# Patient Record
Sex: Male | Born: 1965 | Race: White | Hispanic: No | Smoking: Former smoker
Health system: Southern US, Community
[De-identification: ages and names within clinical notes are randomized; demographics above are authoritative.]

## PROBLEM LIST (undated history)

## (undated) DIAGNOSIS — F32A Depression, unspecified: Secondary | ICD-10-CM

## (undated) DIAGNOSIS — B029 Zoster without complications: Secondary | ICD-10-CM

## (undated) HISTORY — DX: Depression, unspecified: F32.A

---

## 2007-11-13 ENCOUNTER — Ambulatory Visit: Payer: Self-pay | Admitting: Physical Medicine & Rehabilitation

## 2007-11-13 ENCOUNTER — Encounter
Admission: RE | Admit: 2007-11-13 | Discharge: 2008-01-29 | Payer: Self-pay | Admitting: Physical Medicine & Rehabilitation

## 2007-12-14 ENCOUNTER — Ambulatory Visit: Payer: Self-pay | Admitting: Physical Medicine & Rehabilitation

## 2008-01-29 ENCOUNTER — Ambulatory Visit: Payer: Self-pay | Admitting: Physical Medicine & Rehabilitation

## 2008-04-23 ENCOUNTER — Ambulatory Visit (HOSPITAL_COMMUNITY): Admission: RE | Admit: 2008-04-23 | Discharge: 2008-04-23 | Payer: Self-pay | Admitting: Orthopaedic Surgery

## 2008-07-31 ENCOUNTER — Emergency Department (HOSPITAL_BASED_OUTPATIENT_CLINIC_OR_DEPARTMENT_OTHER): Admission: EM | Admit: 2008-07-31 | Discharge: 2008-07-31 | Payer: Self-pay | Admitting: Emergency Medicine

## 2009-11-23 ENCOUNTER — Emergency Department (HOSPITAL_BASED_OUTPATIENT_CLINIC_OR_DEPARTMENT_OTHER): Admission: EM | Admit: 2009-11-23 | Discharge: 2009-11-23 | Payer: Self-pay | Admitting: Emergency Medicine

## 2010-06-22 ENCOUNTER — Encounter: Admission: RE | Admit: 2010-06-22 | Discharge: 2010-06-22 | Payer: Self-pay | Admitting: Geriatric Medicine

## 2010-07-09 ENCOUNTER — Encounter: Admission: RE | Admit: 2010-07-09 | Discharge: 2010-07-09 | Payer: Self-pay | Admitting: Geriatric Medicine

## 2010-09-19 ENCOUNTER — Encounter: Payer: Self-pay | Admitting: Geriatric Medicine

## 2011-01-12 NOTE — Assessment & Plan Note (Signed)
A 45 year old male with history of back pain since age 77.  He has had  some exacerbation last fall associated with seizures that were  result  of hyponatremia.  He was hospitalized at [facility].  He has  had MRI, showing no significant stenosis.  He did receive facet  injections on December 14, 2007, and his back pain has really been cut in  half.  We have been able to reduce his hydrocodone from 10 mg to 7.5 mg.  His main complaint is his neck at this point.  He was originally  scheduled for repeat facet injection today, but he states that this is  feeling pretty good.  He does have some left buttock and left posterior  thigh pain.   He has some numbness and tingling in the posterior thigh as well.   EXAMINATION:  He has tenderness in the bilateral upper trapezius.  His  neck range of motion is about 75%.  His upper extremity strength is  normal.   IMPRESSION:  Cervical pain, question if this is a myofascial versus  cervical facet.  He does have some limitation in range of motion.  He  does have a head forward posture.  I will send him to physical therapy,  after I do trigger point injections, to see if they can work on some  postural training, some upper back strengthening.  I will see him back  in about a month for possible repeat trigger points.      Erick Colace, M.D.  Electronically Signed     AEK/MedQ  D:  01/29/2008 10:32:44  T:  01/30/2008 03:32:57  Job #:  [id number]   cc:   Casimiro Needle, Physical Therapy  [facility]

## 2011-01-12 NOTE — Op Note (Signed)
NAMENICHAEL, EHLY               ACCOUNT NO.:  [id number]   MEDICAL RECORD NO.:  [id number]          PATIENT TYPE:  AMB   LOCATION:  SDS                          FACILITY:  [facility]   PHYSICIAN:  Vanita Panda. Magnus Ivan, M.D.DATE OF BIRTH:  10-Nov-1965   DATE OF PROCEDURE:  04/23/2008  DATE OF DISCHARGE:  04/23/2008                               OPERATIVE REPORT   PREOPERATIVE DIAGNOSIS:  Right wrist laceration with questionable nerve  and tendon injury.   POSTOPERATIVE DIAGNOSIS:  Right wrist laceration with questionable nerve  and tendon injury.   FINDINGS:  Superficial laceration to right wrist median nerve with a  small rent in outer nerve fibers, no nerve repair needed, NeuroGen  conduit placed around nerve.   SURGEON:  Vanita Panda. Magnus Ivan, MD   ANESTHESIA:  General.   ANTIBIOTICS:  Ancef 1 g IV.   COMPLICATIONS:  None.   ESTIMATED BLOOD LOSS:  Minimal.   TOURNIQUET TIME:  37 minutes.   INDICATIONS:  Briefly, Mr. Fridman is 45 year old who 9 days ago cut his  right wrist with some glass.  Apparently, he was seen in the [facility] emergency room and according to his wife an orthopedic  surgeon did see him.  The laceration was repaired in the emergency room,  and he was given a followup to call up the [employer]  the next day.  Apparently, he could not be worked in for 2 more weeks.  This seemed to be unsatisfactory to the patient, and he sought treatment  in [place].  He then went to the Urgent Care Clinic at [facility] on this  past Friday with exquisite pain in his wrist.  The physician on-call  there felt that it is potential for a tendon nerve injury and gave him  followup in my office on Monday for further evaluation and treatment  since I do a hand surgery.  He was given 40 Percocet on Friday but had  gone through all of those through the weekend by his appointment to see  me.  In my office, he reported exquisite pain and  throbbing and global  numbness and tingling in his hand.  On examination, he had subjective  decreased sensation in the median nerve distribution and abundant pain  but did have intact pulses on exam and intact tendon function.  He is  recommended he undergo exploration of the laceration and potential  repair of the nerve.  He understood risks and benefits of this and  agreed to proceed with surgery.   PROCEDURE IN DETAIL:  After informed consent was obtained, appropriate  right leg was marked.  He was brought to the operating room and placed  supine on the operating table.  General anesthesia was then obtained.  A  nonsterile tourniquet was placed around his upper arm and his wrist was  prepped and draped with Betadine scrub and paint.  A time-out was called  to identify the correct patient and correct arm.  I then used Esmarch to  wrap up the arm.  A tourniquet was inflated 250 mmHg pressure.  His  laceration was a longitudinal laceration across the wrist 2 cm proximal  to the wrist crease.  I then extended this in a Z-type fashion distally  and proximally to expose the laceration.  I explored the wrist and you  could see the radial and ulnar arteries were intact and all the tendons  were intact.  The palmaris longus tendon was cut because this was a  nonessential tendon.  I did completely released the median nerve around  the wrist with neurolysis and found hematoma around the nerve.  I then  examined the nerve closely and you could see that there was a laceration  in the superficial nerve fibers on the radial aspect but the nerve  itself was intact.  This was more of the epineurium.  I elected not to  put any sutures through this as a repair since it was just a few  millimeters in depth.  I then chose to place a NeuroGen conduit tube  around the nerve for protection and the nerve lay back down into the  deep fibers.  I then copiously irrigated the wound again and closed the  skin and  its entirety with interrupted 3-0 nylon suture.  Xeroform  followed by well-padded sterile dressing was applied.  The tourniquet  was let down.  The fingers did pink nicely.  The patient was awakened,  extubated, and taken to recovery room in stable condition.      Vanita Panda. Magnus Ivan, M.D.  Electronically Signed     CYB/MEDQ  D:  04/23/2008  T:  04/24/2008  Job:  [id number]

## 2011-01-12 NOTE — Procedures (Signed)
NAMEGEVIN, PEREA               ACCOUNT NO.:  [id number]   MEDICAL RECORD NO.:  [id number]           PATIENT TYPE:   LOCATION:                                 FACILITY:   PHYSICIAN:  Erick Colace, M.D.DATE OF BIRTH:  07-18-1966   DATE OF PROCEDURE:  12/14/2007  DATE OF DISCHARGE:                               OPERATIVE REPORT   PROCEDURE:  Bilateral L5 dorsal ramus injection, bilateral L4 medial  branch block, bilateral L3 medial branch block under fluoroscopic  guidance.   INDICATION:  Lumbar spondylosis with facet syndrome.  The pain is only  partially responsive to medication management including narcotic  analgesic medications.  The pain interferes with activities of daily  living such as walking, limits walking time to 5 minutes.  He has  difficulty with certain household duties.   Informed consent was obtained after describing risks and benefits of the  procedure with the patient.  These include bleeding, bruising,  infection, temporary or permanent paralysis.  He elects to proceed and  has given written consent.  The patient was placed prone on fluoroscopy  table.  Betadine prep, sterile drape, 25 gauge inch and a half needle  was used to anesthetize skin and subcu tissue, 1% lidocaine x2 mL and a  22 gauge 3-1/2 inch spinal needle was inserted first charting left S1  SAP sacral ala junction, bone contact made and confirmed with lateral  imaging.  Omnipaque 180 x 0.5 mL demonstrated no intravascular uptake  then 0.5 mL of dexamethasone lidocaine solution was injected, the  solution containing 1 mL of 40 mg per mL of dexamethasone and 2 mL of 2%  MPF lidocaine.  Then the left L5 SAP transverse process junction  targeted, bone contact made and confirmed with lateral imaging.  Omnipaque 180 with lidocaine solution was injected.  Then the left L4  SAP transverse process junction targeted, bone contact made and  confirmed with lateral imaging.  Omnipaque 180 x 0.5 mL  demonstrated no  intravascular uptake then 0.5 mL of dexamethasone lidocaine solution was  injected.  The same procedure was repeated on corresponding levels on  the right side using the same needle, injectate and technique.  The  patient tolerated the procedure well.  Pre and post injection vitals  stable.  Post injection instructions given to return in 3 weeks for  repeat.  Pre injection pain level 10/10, post injection pain level 5/10.  Will repeat confirmatory.  Consider radiofrequency.      Erick Colace, M.D.  Electronically Signed     AEK/MEDQ  D:  12/14/2007 10:53:49  T:  12/14/2007 11:24:00  Job:  [id number]   cc:   Casimiro Needle L. Thad Ranger, M.D.  Fax: [phone]

## 2011-01-12 NOTE — Procedures (Signed)
NAMEMIRO, BALDERSON               ACCOUNT NO.:  [id number]   MEDICAL RECORD NO.:  [id number]          PATIENT TYPE:  REC   LOCATION:  TPC                          FACILITY:  [facility]   PHYSICIAN:  Erick Colace, M.D.DATE OF BIRTH:  March 26, 1966   DATE OF PROCEDURE:  01/29/2008  DATE OF DISCHARGE:                               OPERATIVE REPORT   This is bilateral upper trapezius and bilateral levator scapula trigger  point injection.   INDICATIONS:  Cervicothoracic myofascial pain syndrome, only partial  response to medication management including narcotic analgesics.   Informed consent was obtained after describing the risks and benefits of  the procedure to the patient.  These include bleeding, bruising, and  infection.  He elected to proceed and has given written consent.   The patient in seated position, area over the upper trap at the  midclavicular line marked, prepped with Betadine as with the levator  scapula, insertion upon the upper medial scapular border marked, prepped  with Betadine, entered with 25-gauge 1.5-inch needle.  Positive twitch  sign obtained in left upper trap, 0.75 mL of 1% lidocaine with trigger  point deactivation at each site was performed.  The patient tolerated  procedure well.  Postprocedure instructions were given.       Erick Colace, M.D.  Electronically Signed     AEK/MEDQ  D:  01/29/2008 10:30:31  T:  01/30/2008 00:01:16  Job:  [id number]   cc:   Casimiro Needle, Physical Therapy  [facility]

## 2011-06-04 LAB — DIFFERENTIAL
Basophils Absolute: 0.1 10*3/uL (ref 0.0–0.1)
Eosinophils Absolute: 0.1 10*3/uL (ref 0.0–0.7)
Eosinophils Relative: 2 % (ref 0–5)
Lymphs Abs: 1.6 10*3/uL (ref 0.7–4.0)

## 2011-06-04 LAB — COMPREHENSIVE METABOLIC PANEL
ALT: 27 U/L (ref 0–53)
AST: 32 U/L (ref 0–37)
Alkaline Phosphatase: 53 U/L (ref 39–117)
CO2: 28 mEq/L (ref 19–32)
Chloride: 101 mEq/L (ref 96–112)
GFR calc Af Amer: >60 mL/min (ref >60)
GFR calc non Af Amer: >60 mL/min (ref >60)
Potassium: 4.3 mEq/L (ref 3.5–5.1)
Sodium: 138 mEq/L (ref 135–145)
Total Bilirubin: 0.4 mg/dL (ref 0.3–1.2)

## 2011-06-04 LAB — CBC
RBC: 4.71 MIL/uL (ref 4.22–5.81)
WBC: 8.3 10*3/uL (ref 4.0–10.5)

## 2011-06-04 LAB — RPR: RPR Ser Ql: NONREACTIVE

## 2011-07-09 ENCOUNTER — Encounter: Payer: Self-pay | Admitting: *Deleted

## 2011-07-09 ENCOUNTER — Emergency Department (HOSPITAL_COMMUNITY): Payer: Self-pay

## 2011-07-09 ENCOUNTER — Emergency Department (HOSPITAL_COMMUNITY)
Admission: EM | Admit: 2011-07-09 | Discharge: 2011-07-09 | Disposition: A | Discharge status: Home / Self Care | Payer: Self-pay | Attending: Emergency Medicine | Admitting: Emergency Medicine

## 2011-07-09 DIAGNOSIS — Y9289 Other specified places as the place of occurrence of the external cause: Secondary | ICD-10-CM | POA: Insufficient documentation

## 2011-07-09 DIAGNOSIS — S161XXA Strain of muscle, fascia and tendon at neck level, initial encounter: Secondary | ICD-10-CM

## 2011-07-09 DIAGNOSIS — S63501A Unspecified sprain of right wrist, initial encounter: Secondary | ICD-10-CM

## 2011-07-09 DIAGNOSIS — S139XXA Sprain of joints and ligaments of unspecified parts of neck, initial encounter: Secondary | ICD-10-CM | POA: Insufficient documentation

## 2011-07-09 DIAGNOSIS — F17200 Nicotine dependence, unspecified, uncomplicated: Secondary | ICD-10-CM | POA: Insufficient documentation

## 2011-07-09 DIAGNOSIS — S63509A Unspecified sprain of unspecified wrist, initial encounter: Secondary | ICD-10-CM | POA: Insufficient documentation

## 2011-07-09 DIAGNOSIS — W010XXA Fall on same level from slipping, tripping and stumbling without subsequent striking against object, initial encounter: Secondary | ICD-10-CM | POA: Insufficient documentation

## 2011-07-09 MED ORDER — HYDROCODONE-ACETAMINOPHEN 5-325 MG PO TABS
1.0000 | ORAL_TABLET | Freq: Once | ORAL | Status: AC
  Administered 2011-07-09: 1 via ORAL
  Filled 2011-07-09: qty 1

## 2011-07-09 MED ORDER — HYDROCODONE-ACETAMINOPHEN 5-325 MG PO TABS: ORAL_TABLET | ORAL | Status: DC

## 2011-07-09 MED ORDER — IBUPROFEN 800 MG PO TABS
800.0000 mg | ORAL_TABLET | Freq: Once | ORAL | Status: AC
  Administered 2011-07-09: 800 mg via ORAL
  Filled 2011-07-09: qty 1

## 2011-07-09 MED ORDER — TETANUS-DIPHTH-ACELL PERTUSSIS 5-2.5-18.5 LF-MCG/0.5 IM SUSP
0.5000 mL | Freq: Once | INTRAMUSCULAR | Status: AC
  Administered 2011-07-09: 0.5 mL via INTRAMUSCULAR
  Filled 2011-07-09: qty 0.5

## 2011-07-09 NOTE — ED Notes (Signed)
Pt a/ox4. Resp even and unlabored. NAD at this time. D/C instructions and Rx x1 reviewed with pt. Pt verbalized understanding. Pt ambulated to POV with steady gate. Wife with pt to transport home.

## 2011-07-09 NOTE — ED Provider Notes (Signed)
History     CSN: [id number] Arrival date & time: 07/09/2011  2:07 PM   First MD Initiated Contact with Patient 07/09/11 1428      Chief Complaint  Patient presents with  . Fall    (Consider location/radiation/quality/duration/timing/severity/associated sxs/prior treatment) Patient is a 45 y.o. male presenting with fall. The history is provided by the patient. No language interpreter was used.  Fall The accident occurred 6 to 12 hours ago. 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  caught weight on extended R hand .  also twisted neck. Distance fallen: standing height. He landed on concrete. There was no blood loss. The point of impact was the right wrist. The pain is present in the right wrist (cervical spine.). The pain is at a severity of 10/10. He was ambulatory at the scene. There was no entrapment after the fall. There was no drug use involved in the accident. There was no alcohol use involved in the accident. Exacerbated by: movement and palpation. He has tried nothing for the symptoms.    History reviewed. No pertinent past medical history.  History reviewed. No pertinent past surgical history.  No family history on file.  History  Substance Use Topics  . Smoking status: Current Everyday Smoker  . Smokeless tobacco: Not on file  . Alcohol Use: No      Review of Systems  Musculoskeletal:       Trauma  All other systems reviewed and are negative.    Allergies  Review of patient's allergies indicates no known allergies.  Home Medications  No current outpatient prescriptions on file.  BP 109/68  Pulse 87  Temp(Src) 99.8 F (37.7 C) (Oral)  Resp 18  Ht 5\' 9"  (1.753 m)  Wt 165 lb (74.844 kg)  BMI 24.37 kg/m2  SpO2 99%  Physical Exam  Nursing note and vitals reviewed. Constitutional: He is oriented to person, place, and time. He appears well-developed and well-nourished.  HENT:  Head:  Normocephalic and atraumatic.  Eyes: EOM are normal.  Cardiovascular: Normal rate, regular rhythm, normal heart sounds and intact distal pulses.   Pulmonary/Chest: Effort normal and breath sounds normal. No respiratory distress.  Abdominal: Soft. He exhibits no distension. There is no tenderness.  Musculoskeletal: He exhibits tenderness.       Cervical back: He exhibits decreased range of motion, tenderness, bony tenderness and pain. He exhibits no swelling, no edema, no deformity, no laceration, no spasm and normal pulse.       Back:       Right hand: He exhibits decreased range of motion, tenderness and bony tenderness. He exhibits normal capillary refill, no deformity, no laceration and no swelling. normal sensation noted. Normal strength noted.       Hands: Neurological: He is alert and oriented to person, place, and time. He displays normal reflexes.  Skin: Skin is warm and dry.  Psychiatric: He has a normal mood and affect. Judgment normal.    ED Course  Procedures (including critical care time)  Labs Reviewed - No data to display No results found.   No diagnosis found.    MDM          Worthy Rancher, PA 07/09/11 [phone]

## 2011-07-09 NOTE — ED Notes (Signed)
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 Pain to right hand, wrist, and upper back. NAD.

## 2011-07-12 NOTE — ED Provider Notes (Signed)
Medical screening examination/treatment/procedure(s) were performed by non-physician practitioner and as supervising physician I was immediately available for consultation/collaboration.  Nicoletta Dress. Colon Branch, MD 07/12/11 1756

## 2011-12-22 ENCOUNTER — Encounter (HOSPITAL_COMMUNITY): Payer: Self-pay | Admitting: Emergency Medicine

## 2011-12-22 ENCOUNTER — Emergency Department (HOSPITAL_COMMUNITY): Payer: Self-pay

## 2011-12-22 ENCOUNTER — Emergency Department (HOSPITAL_COMMUNITY)
Admission: EM | Admit: 2011-12-22 | Discharge: 2011-12-22 | Disposition: A | Discharge status: Home / Self Care | Payer: Self-pay | Attending: Emergency Medicine | Admitting: Emergency Medicine

## 2011-12-22 DIAGNOSIS — R079 Chest pain, unspecified: Secondary | ICD-10-CM | POA: Insufficient documentation

## 2011-12-22 DIAGNOSIS — S20219A Contusion of unspecified front wall of thorax, initial encounter: Secondary | ICD-10-CM | POA: Insufficient documentation

## 2011-12-22 DIAGNOSIS — M549 Dorsalgia, unspecified: Secondary | ICD-10-CM | POA: Insufficient documentation

## 2011-12-22 DIAGNOSIS — W108XXA Fall (on) (from) other stairs and steps, initial encounter: Secondary | ICD-10-CM | POA: Insufficient documentation

## 2011-12-22 DIAGNOSIS — M542 Cervicalgia: Secondary | ICD-10-CM | POA: Insufficient documentation

## 2011-12-22 DIAGNOSIS — F17200 Nicotine dependence, unspecified, uncomplicated: Secondary | ICD-10-CM | POA: Insufficient documentation

## 2011-12-22 MED ORDER — OXYCODONE-ACETAMINOPHEN 5-325 MG PO TABS: 1.0000 | ORAL_TABLET | ORAL | Status: AC | PRN

## 2011-12-22 MED ORDER — OXYCODONE-ACETAMINOPHEN 5-325 MG PO TABS
1.0000 | ORAL_TABLET | Freq: Once | ORAL | Status: AC
  Administered 2011-12-22: 1 via ORAL
  Filled 2011-12-22: qty 1

## 2011-12-22 MED ORDER — KETOROLAC TROMETHAMINE 60 MG/2ML IM SOLN
60.0000 mg | Freq: Once | INTRAMUSCULAR | Status: AC
  Administered 2011-12-22: 60 mg via INTRAMUSCULAR
  Filled 2011-12-22: qty 2

## 2011-12-22 MED ORDER — IBUPROFEN 800 MG PO TABS: 800.0000 mg | ORAL_TABLET | Freq: Three times a day (TID) | ORAL | Status: AC

## 2011-12-22 MED ORDER — BACITRACIN-NEOMYCIN-POLYMYXIN 400-5-5000 EX OINT
TOPICAL_OINTMENT | Freq: Once | CUTANEOUS | Status: AC
  Administered 2011-12-22: 11:00:00 via TOPICAL
  Filled 2011-12-22: qty 2

## 2011-12-22 NOTE — ED Provider Notes (Signed)
History     CSN: [id number]  Arrival date & time 12/22/11  7829   First MD Initiated Contact with Patient 12/22/11 [phone]      Chief Complaint  Patient presents with  . Fall  . Back Pain    HPI Kristopher Yoder is a 46 y.o. male who presents to the ED for pain in the right ribs. He was going down concrete steps this morning and fell. He hit his right side on the steps. He denies any other injuries. The history was provided by the patient.  History reviewed. No pertinent past medical history.  History reviewed. No pertinent past surgical history.  History reviewed. No pertinent family history.  History  Substance Use Topics  . Smoking status: Current Everyday Smoker    Types: Cigarettes  . Smokeless tobacco: Not on file  . Alcohol Use: No      Review of Systems  Constitutional: Negative for fever and chills.  HENT: Positive for neck pain. Negative for ear pain and nosebleeds.   Eyes: Negative.   Respiratory: Negative for chest tightness and shortness of breath.   Cardiovascular: Negative for palpitations. Chest pain: rib pain.  Gastrointestinal: Negative for nausea, vomiting and abdominal pain.  Musculoskeletal: Positive for back pain. Negative for gait problem.  Skin:       Abrasion right side  Neurological: Negative for dizziness and headaches.  Psychiatric/Behavioral: Negative for agitation. The patient is not nervous/anxious.     Allergies  Review of patient's allergies indicates no known allergies.  Home Medications   Current Outpatient Rx  Name Route Sig Dispense Refill  . HYDROCODONE-ACETAMINOPHEN 5-325 MG PO TABS  One po q 4-6 hrs prn pain 20 tablet 0    BP 118/66  Pulse 90  Temp(Src) 98.4 F (36.9 C) (Oral)  Resp 17  Ht 5\' 9"  (1.753 m)  Wt 180 lb (81.647 kg)  BMI 26.58 kg/m2  SpO2 99%  Physical Exam  Nursing note and vitals reviewed. Constitutional: He is oriented to person, place, and time. He appears well-developed and well-nourished. No  distress.  HENT:  Head: Normocephalic and atraumatic.  Eyes: Conjunctivae and EOM are normal.  Neck: Normal range of motion. Neck supple.       Tenderness on palpation left side  Cardiovascular: Normal rate and regular rhythm.   Pulmonary/Chest: Effort normal and breath sounds normal.   He exhibits tenderness. He exhibits no crepitus and no deformity.         Abrasion and tenderness with palpation right posterior rib area.   Abdominal: There is no tenderness.  Musculoskeletal: Normal range of motion.  Neurological: He is alert and oriented to person, place, and time. No cranial nerve deficit.  Psychiatric: He has a normal mood and affect. His behavior is normal. Judgment and thought content normal.    ED Course  Procedures  Labs Reviewed - No data to display Dg Ribs Unilateral W/chest Right  12/22/2011  *RADIOLOGY REPORT*  Clinical Data: Larey Seat.  Right-sided chest and rib pain.  RIGHT RIBS AND CHEST - 3+ VIEW  Comparison: 12/18/2011  Findings: Pain heart size is normal.  Mediastinal shadows are normal.  Lungs are clear.  No pneumothorax pneumothorax.  Right rib details do not show a fracture.  IMPRESSION: No visible rib fracture.  No active cardiopulmonary disease.  Original Report Authenticated By: Thomasenia Sales, M.D.   Assessment: Contusion right ribs   Abrasion   Plan:  Ibuprofen 800 mg Rx   Percocet Rx   Follow  up with PCP, return as needed.   I have discussed the clinical and x-ray finding with the patient and he voices understanding. I discussed complications associated with rib injuries and need for deep breathing   I have reviewed this patient's vital signs, nurses notes, appropriate labs and imaging. I have discussed this patient with Dr. Clarene Duke.   MDM          Janne Napoleon, NP 12/22/11 1036

## 2011-12-22 NOTE — ED Notes (Signed)
Pt slipped and fell this morning. Abrasion right mid/upper back.

## 2011-12-22 NOTE — Discharge Instructions (Signed)
Bone Bruise  A bone bruise is a small hidden fracture of the bone. It typically occurs with bones located close to the surface of the skin.  SYMPTOMS  The pain lasts longer than a normal bruise.   The bruised area is difficult to use.   There may be discoloration or swelling of the bruised area.   When a bone bruise is found with injury to the anterior cruciate ligament (in the knee) there is often an increased:   Amount of fluid in the knee   Time the fluid in the knee lasts.   Number of days until you are walking normally and regaining the motion you had before the injury.   Number of days with pain from the injury.  DIAGNOSIS  It can only be seen on X-rays known as MRIs. This stands for magnetic resonance imaging. A regular X-ray taken of a bone bruise would appear to be normal. A bone bruise is a common injury in the knee and the heel bone (calcaneus). The problems are similar to those produced by stress fractures, which are bone injuries caused by overuse. A bone bruise may also be a sign of other injuries. For example, bone bruises are commonly found where an anterior cruciate ligament (ACL) in the knee has been pulled away from the bone (ruptured). A ligament is a tough fibrous material that connects bones together to make our joints stable. Bruises of the bone last a lot longer than bruises of the muscle or tissues beneath the skin. Bone bruises can last from days to months and are often more severe and painful than other bruises. TREATMENT Because bone bruises are sudden injuries you cannot often prevent them, other than by being extremely careful. Some things you can do to improve the condition are:  Apply ice to the sore area for 15 to 20 minutes, 3 to 4 times per day while awake for the first 2 days. Put the ice in a plastic bag, and place a towel between the bag of ice and your skin.   Keep your bruised area raised (elevated) when possible to lessen swelling.   For activity:     Use crutches when necessary; do not put weight on the injured leg until you are no longer tender.   You may walk on your affected part as the pain allows, or as instructed.   Start weight bearing gradually on the bruised part.   Continue to use crutches or a cane until you can stand without causing pain, or as instructed.   If a plaster splint was applied, wear the splint until you are seen for a follow-up examination. Rest it on nothing harder than a pillow the first 24 hours. Do not put weight on it. Do not get it wet. You may take it off to take a shower or bath.   If an air splint was applied, more air may be blown into or out of the splint as needed for comfort. You may take it off at night and to take a shower or bath.   Wiggle your toes in the splint several times per day if you are able.   You may have been given an elastic bandage to use with the plaster splint or alone. The splint is too tight if you have numbness, tingling or if your foot becomes cold and blue. Adjust the bandage to make it comfortable.   Only take over-the-counter or prescription medicines for pain, discomfort, or fever as directed by   discomfort, or fever as directed by your caregiver.   Follow all instructions for follow up with your caregiver. This includes any orthopedic referrals, physical therapy, and rehabilitation. Any delay in obtaining necessary care could result in a delay or failure of the bones to heal.  SEEK MEDICAL CARE IF:    You have an increase in bruising, swelling, or pain.   You notice coldness of your toes.   You do not get pain relief with medications.  SEEK IMMEDIATE MEDICAL CARE IF:    Your toes are numb or blue.   You have severe pain not controlled with medications.   If any of the problems that caused you to seek care are becoming worse.  Document Released: 11/06/2003 Document Revised: 08/05/2011 Document Reviewed: 03/20/2008  [employer] Patient Information 2012 [employer].

## 2011-12-22 NOTE — ED Notes (Signed)
Pt states he tripped on the steps and c/o mid back pain. Pt has abrasion to the left mid back.

## 2011-12-22 NOTE — ED Provider Notes (Signed)
Medical screening examination/treatment/procedure(s) were performed by non-physician practitioner and as supervising physician I was immediately available for consultation/collaboration.   Laray Anger, DO 12/22/11 2054

## 2011-12-22 NOTE — ED Notes (Signed)
Abrasion cleansed with sterile water. Bacitracin applied with adaptic and 4x4 dressing.

## 2011-12-27 ENCOUNTER — Emergency Department (HOSPITAL_COMMUNITY)
Admission: EM | Admit: 2011-12-27 | Discharge: 2011-12-27 | Disposition: A | Discharge status: Home / Self Care | Payer: Self-pay | Attending: Emergency Medicine | Admitting: Emergency Medicine

## 2011-12-27 ENCOUNTER — Encounter (HOSPITAL_COMMUNITY): Payer: Self-pay | Admitting: Emergency Medicine

## 2011-12-27 DIAGNOSIS — W010XXA Fall on same level from slipping, tripping and stumbling without subsequent striking against object, initial encounter: Secondary | ICD-10-CM | POA: Insufficient documentation

## 2011-12-27 DIAGNOSIS — S20219A Contusion of unspecified front wall of thorax, initial encounter: Secondary | ICD-10-CM | POA: Insufficient documentation

## 2011-12-27 DIAGNOSIS — F17200 Nicotine dependence, unspecified, uncomplicated: Secondary | ICD-10-CM | POA: Insufficient documentation

## 2011-12-27 MED ORDER — OXYCODONE-ACETAMINOPHEN 5-325 MG PO TABS: 1.0000 | ORAL_TABLET | ORAL | Status: AC | PRN

## 2011-12-27 MED ORDER — OXYCODONE-ACETAMINOPHEN 5-325 MG PO TABS
1.0000 | ORAL_TABLET | Freq: Once | ORAL | Status: AC
  Administered 2011-12-27: 1 via ORAL
  Filled 2011-12-27: qty 1

## 2011-12-27 NOTE — ED Provider Notes (Signed)
Medical screening examination/treatment/procedure(s) were conducted as a shared visit with non-physician practitioner(s) and myself.  I personally evaluated the patient during the encounter  Fall 5 days ago with R posterior rib pain.  Xray negative then. Abdomen soft and nontender.  FAST BEDSIDE US Indication: blunt trauma  4 Views obtained: Splenorenal, Morrison's Pouch, Retrovesical, Pericardial No free fluid in abdomen No pericardial effusion No difficulty obtaining views. Archived electronically I personally performed and interrepreted the images   Glynn Octave, MD 12/27/11 1521

## 2011-12-27 NOTE — ED Notes (Signed)
EDPA is in with the pt at this time. 

## 2011-12-27 NOTE — ED Notes (Signed)
Pt still having rib pain from fall last week.

## 2011-12-27 NOTE — ED Provider Notes (Signed)
History     CSN: [id number]  Arrival date & time 12/27/11  5621   First MD Initiated Contact with Patient 12/27/11 [phone]      Chief Complaint  Patient presents with  . Rib Injury    here last week for same.    (Consider location/radiation/quality/duration/timing/severity/associated sxs/prior treatment) HPI Comments: Patient returns for recheck of his right posterior rib pain which started 5 days ago when he tripped and landed on a cement step against his right mid posterior ribs.  He ran out of his Percocet yesterday and his pain has returned.  He is using ibuprofen 800 mg 3 times a day which is getting minimal relief.  He's also using a home lumbar support wraparound brace for extra support.  He denies shortness of breath, cough or fever, but does have increased pain with attempts to take a deep breath.  He does have an abscess from right home from a prior injury but is not currently using this.  Bending twisting and deep inspiration takes pain worse, rest results pain.  The history is provided by the patient.    History reviewed. No pertinent past medical history.  History reviewed. No pertinent past surgical history.  History reviewed. No pertinent family history.  History  Substance Use Topics  . Smoking status: Current Everyday Smoker    Types: Cigarettes  . Smokeless tobacco: Not on file  . Alcohol Use: No      Review of Systems  Constitutional: Negative for fever.  HENT: Negative for congestion, sore throat and neck pain.   Eyes: Negative.   Respiratory: Negative for cough, chest tightness, shortness of breath, wheezing and stridor.   Cardiovascular: Negative for chest pain.  Gastrointestinal: Negative for nausea and abdominal pain.  Genitourinary: Negative.   Musculoskeletal: Positive for back pain. Negative for joint swelling and arthralgias.  Skin: Negative.  Negative for rash and wound.  Neurological: Negative for dizziness, weakness, light-headedness,  numbness and headaches.  Hematological: Negative.   Psychiatric/Behavioral: Negative.     Allergies  Review of patient's allergies indicates no known allergies.  Home Medications   Current Outpatient Rx  Name Route Sig Dispense Refill  . HYDROCODONE-ACETAMINOPHEN 5-325 MG PO TABS  One po q 4-6 hrs prn pain 20 tablet 0  . IBUPROFEN 800 MG PO TABS Oral Take 1 tablet (800 mg total) by mouth 3 (three) times daily. 21 tablet 0  . OXYCODONE-ACETAMINOPHEN 5-325 MG PO TABS Oral Take 1 tablet by mouth every 4 (four) hours as needed for pain. 20 tablet 0  . OXYCODONE-ACETAMINOPHEN 5-325 MG PO TABS Oral Take 1 tablet by mouth every 4 (four) hours as needed for pain. 20 tablet 0    BP 105/69  Pulse 99  Temp(Src) 98.3 F (36.8 C) (Oral)  Resp 18  Ht 5\' 9"  (1.753 m)  Wt 180 lb (81.647 kg)  BMI 26.58 kg/m2  SpO2 99%  Physical Exam  Nursing note and vitals reviewed. Constitutional: He appears well-developed and well-nourished.  HENT:  Head: Normocephalic and atraumatic.  Eyes: Conjunctivae are normal.  Neck: Normal range of motion.  Cardiovascular: Normal rate, regular rhythm, normal heart sounds and intact distal pulses.   Pulmonary/Chest: Effort normal and breath sounds normal. He has no wheezes.  Abdominal: Soft. Bowel sounds are normal. There is no tenderness.  Musculoskeletal: Normal range of motion. He exhibits tenderness. He exhibits no edema.       Back:  Neurological: He is alert.  Skin: Skin is warm and dry.  Psychiatric: He has a normal mood and affect.    ED Course  Procedures (including critical care time)  Labs Reviewed - No data to display No results found.   1. Contusion of ribs       MDM  X-rays from visit 5 days ago reviewed with no sign of bony injury or rib fracture.  No indication to repeat x-ray today.  Patient was prescribed Percocet and encouraged to continue with his ibuprofen and to adhesive pad 20 minutes 4 times daily.  Also discussed using his  incentive spirometer.  Referrals were given for primary medical care, however patient was encouraged to return here if his symptoms worsen, especially if he develops fever, pain is not improved over the next week or any new symptoms develop.   Pt also seen by Dr. Manus Gunning,  Who performed bedside FAST exam which was normal.  Burgess Amor, PA 12/27/11 7829  Burgess Amor, PA 12/27/11 5621

## 2011-12-27 NOTE — Discharge Instructions (Signed)
Chest Contusion You have been checked for injuries to your chest. Your caregiver has not found injuries serious enough to require hospitalization. It is common to have bruises and sore muscles after an injury. These tend to feel worse the first 24 hours. You may gradually develop more stiffness and soreness over the next several hours to several days. This usually feels worse the first morning following your injury. After a few days, you will usually begin to improve. The amount of improvement depends on the amount of damage. Following the accident, if the pain in any area continues to increase or you develop new areas of pain, you should see your primary caregiver or return to the Emergency Department for re-evaluation. HOME CARE INSTRUCTIONS   Put ice on sore areas every 2 hours for 20 minutes while awake for the next 2 days.   Drink extra fluids. Do not drink alcohol.   Activity as tolerated. Lifting may make pain worse.   Only take over-the-counter or prescription medicines for pain, discomfort, or fever as directed by your caregiver. Do not use aspirin. This may increase bruising or increase bleeding.  SEEK IMMEDIATE MEDICAL CARE IF:   There is a worsening of any of the problems that brought you in for care.   Shortness of breath, dizziness or fainting develop.   You have chest pain, difficulty breathing, or develop pain going down the left arm or up into jaw.   You feel sick to your stomach (nausea), vomiting or sweats.   You have increasing belly (abdominal) discomfort.   There is blood in your urine, stool, or if you vomit blood.   There is pain in either shoulder in an area where a shoulder strap would be.   You have feelings of lightheadedness, or if you should have a fainting episode.   You have numbness, tingling, weakness, or problems with the use of your arms or legs.   Severe headaches not relieved with medications develop.   You have a change in bowel or bladder  control.   There is increasing pain in any areas of the body.  If you feel your symptoms are worsening, and you are not able to see your primary caregiver, return to the Emergency Department immediately. MAKE SURE YOU:   Understand these instructions.   Will watch your condition.   Will get help right away if you are not doing well or get worse.  Document Released: 05/11/2001 Document Revised: 08/05/2011 Document Reviewed: 04/03/2008 [facility] Patient Information 2012 [place], [place].   Start using your incentive spirometer as discussed.  Do not drive within 4 hours of taking the Percocet prescription medication as this will make you drowsy.  See the resource guide below for finding a local primary physician.  Return here for any problems or concerns or if your symptoms are not with this treatment.    RESOURCE GUIDE  Dental Problems  Patients with Medicaid: [facility] [phone] [address]                                           [id number] [address] [employer] Phone:  [phone]  Phone:  [phone]  If unable to pay or uninsured, contact:  [facility] or [facility]. to become qualified for the adult dental clinic.  Chronic Pain Problems Contact [facility]  [phone] Patients need to be referred by their primary care doctor.  Insufficient Money for Medicine Contact [employer]:  call "211" or [facility] [phone].  No Primary Care Doctor Call [facility]  [phone] Other agencies that provide inexpensive medical care    [facility]  [phone]    [facility]  [phone]    [facility]  [phone]    [facility]  [phone]    [facility]  [phone]    [facility]  [phone]  Psychological Services [facility]  [phone] [address] [facility] Services  [phone] [facility] Mental  Health   [phone] (emergency services [phone])  Substance Abuse Resources [facility]  [phone] [facility] [phone] [facility] [phone] Floydene Flock [phone] Residential & Outpatient Substance Abuse Program  8137574833  Abuse/Neglect [facility] Child Abuse Hotline 516-389-2625 [facility] Child Abuse Hotline [phone] (After Hours)  Emergency Shelter [facility] [phone]  Maternity Homes [facility] [place] 8021716635 [facility] 8080374437  MRSA Hotline #:   541-607-1849    [facility] Resources  [facility] [place]     [employer]                          [facility] Dept. [address] [place]                       [address]      [address]  Blondell Reveal Phone:  [phone]                                   Phone:  [phone]                 Phone:  [phone]  [facility] Phone:  [phone]  [facility] Child Abuse Hotline 562-737-6202 [phone] (After Hours)

## 2012-03-30 ENCOUNTER — Encounter (HOSPITAL_COMMUNITY): Payer: Self-pay | Admitting: *Deleted

## 2012-03-30 ENCOUNTER — Emergency Department (HOSPITAL_COMMUNITY)
Admission: EM | Admit: 2012-03-30 | Discharge: 2012-03-30 | Disposition: A | Discharge status: Home / Self Care | Payer: Self-pay | Attending: Emergency Medicine | Admitting: Emergency Medicine

## 2012-03-30 DIAGNOSIS — R51 Headache: Secondary | ICD-10-CM | POA: Insufficient documentation

## 2012-03-30 DIAGNOSIS — B029 Zoster without complications: Secondary | ICD-10-CM

## 2012-03-30 DIAGNOSIS — F17200 Nicotine dependence, unspecified, uncomplicated: Secondary | ICD-10-CM | POA: Insufficient documentation

## 2012-03-30 MED ORDER — PREDNISONE 20 MG PO TABS
60.0000 mg | ORAL_TABLET | Freq: Once | ORAL | Status: AC
  Administered 2012-03-30: 60 mg via ORAL
  Filled 2012-03-30: qty 3

## 2012-03-30 MED ORDER — GABAPENTIN 400 MG PO CAPS
400.0000 mg | ORAL_CAPSULE | Freq: Once | ORAL | Status: AC
  Administered 2012-03-30: 400 mg via ORAL
  Filled 2012-03-30: qty 1

## 2012-03-30 MED ORDER — ACYCLOVIR 400 MG PO TABS: ORAL_TABLET | ORAL | Status: DC

## 2012-03-30 MED ORDER — GABAPENTIN 300 MG PO CAPS: 300.0000 mg | ORAL_CAPSULE | Freq: Three times a day (TID) | ORAL | Status: DC

## 2012-03-30 MED ORDER — OXYCODONE-ACETAMINOPHEN 5-325 MG PO TABS
2.0000 | ORAL_TABLET | Freq: Once | ORAL | Status: AC
  Administered 2012-03-30: 1 via ORAL
  Filled 2012-03-30: qty 2

## 2012-03-30 MED ORDER — OXYCODONE-ACETAMINOPHEN 5-325 MG PO TABS
ORAL_TABLET | ORAL | Status: AC
  Administered 2012-03-30: 1
  Filled 2012-03-30: qty 1

## 2012-03-30 MED ORDER — PREDNISONE 50 MG PO TABS: ORAL_TABLET | ORAL | Status: AC

## 2012-03-30 MED ORDER — ACYCLOVIR 800 MG PO TABS
800.0000 mg | ORAL_TABLET | Freq: Once | ORAL | Status: AC
  Administered 2012-03-30: 800 mg via ORAL
  Filled 2012-03-30: qty 1

## 2012-03-30 MED ORDER — OXYCODONE-ACETAMINOPHEN 5-325 MG PO TABS: 1.0000 | ORAL_TABLET | ORAL | Status: DC | PRN

## 2012-03-30 NOTE — ED Provider Notes (Cosign Needed)
History     CSN: [id number]  Arrival date & time 03/30/12  4098   First MD Initiated Contact with Patient 03/30/12 [phone]      Chief Complaint  Patient presents with  . Headache    (Consider location/radiation/quality/duration/timing/severity/associated sxs/prior treatment) HPI  Kristopher Yoder is a 46 y.o. male who presents to the Emergency Department complaining of burning pain to the left top of his scalp that began two days ago and became worse overnight. There are no lesions however the skin hurts to the touch. Denies injury, headache, fever, chills, nasal congestion, vision changes.  History reviewed. No pertinent past medical history.  History reviewed. No pertinent past surgical history.  No family history on file.  History  Substance Use Topics  . Smoking status: Current Everyday Smoker    Types: Cigarettes  . Smokeless tobacco: Not on file  . Alcohol Use: No      Review of Systems  Constitutional: Negative for fever.       10 Systems reviewed and are negative for acute change except as noted in the HPI.  HENT: Negative for congestion.   Eyes: Negative for discharge and redness.  Respiratory: Negative for cough and shortness of breath.   Cardiovascular: Negative for chest pain.  Gastrointestinal: Negative for vomiting and abdominal pain.  Musculoskeletal: Negative for back pain.  Skin: Negative for rash.       Scalp burning  Neurological: Negative for syncope, numbness and headaches.  Psychiatric/Behavioral:       No behavior change.    Allergies  Review of patient's allergies indicates no known allergies.  Home Medications   Current Outpatient Rx  Name Route Sig Dispense Refill  . HYDROCODONE-ACETAMINOPHEN 5-325 MG PO TABS  One po q 4-6 hrs prn pain 20 tablet 0    BP 124/72  Pulse 95  Temp 97.9 F (36.6 C) (Oral)  Resp 16  Ht 5\' 9"  (1.753 m)  Wt 180 lb (81.647 kg)  BMI 26.58 kg/m2  SpO2 99%  Physical Exam  Nursing note and vitals  reviewed. Constitutional: He appears well-developed and well-nourished.       Awake, alert, nontoxic appearance.  HENT:  Head: Normocephalic and atraumatic.    Right Ear: External ear normal.  Left Ear: External ear normal.  Nose: Nose normal.  Mouth/Throat: Oropharynx is clear and moist.  Eyes: Conjunctivae and EOM are normal. Pupils are equal, round, and reactive to light. Right eye exhibits no discharge. Left eye exhibits no discharge.       Eyes without lesions using opthalmascope  Neck: Neck supple.  Cardiovascular: Normal heart sounds.   Pulmonary/Chest: Effort normal and breath sounds normal. He exhibits no tenderness.  Abdominal: Soft. There is no tenderness. There is no rebound.  Musculoskeletal: He exhibits no tenderness.       Baseline ROM, no obvious new focal weakness.  Neurological: He is alert.       Mental status and motor strength appears baseline for patient and situation.  Skin: No rash noted.       No rash present to scalp. Painful with touch in what would be the opthalmic region of the left upper scalp. Pain does not cross the midline. Currently located at the hairline extending back toward the crown of the head.   Psychiatric: He has a normal mood and affect.    ED Course  Procedures (including critical care time)     MDM  Patient with beginning symptoms c/w shingles and in distribution of opthalmic  dermatone. Initiated therapies. Patient encouraged to follow up if symptoms worse.Pt stable in ED with no significant deterioration in condition.The patient appears reasonably screened and/or stabilized for discharge and I doubt any other medical condition or other [facility] requiring further screening, evaluation, or treatment in the ED at this time prior to discharge.  MDM Reviewed: nursing note and vitals           Nicoletta Dress. Colon Branch, MD 03/30/12 [phone]

## 2012-03-30 NOTE — ED Notes (Signed)
Pt thinks he may have shingles in his head, states feels like scalp is burning.

## 2012-04-01 ENCOUNTER — Encounter (HOSPITAL_COMMUNITY): Payer: Self-pay | Admitting: Emergency Medicine

## 2012-04-01 ENCOUNTER — Emergency Department (HOSPITAL_COMMUNITY)
Admission: EM | Admit: 2012-04-01 | Discharge: 2012-04-01 | Disposition: A | Discharge status: Home / Self Care | Payer: Self-pay | Attending: Emergency Medicine | Admitting: Emergency Medicine

## 2012-04-01 DIAGNOSIS — S058X9A Other injuries of unspecified eye and orbit, initial encounter: Secondary | ICD-10-CM | POA: Insufficient documentation

## 2012-04-01 DIAGNOSIS — S0500XA Injury of conjunctiva and corneal abrasion without foreign body, unspecified eye, initial encounter: Secondary | ICD-10-CM

## 2012-04-01 DIAGNOSIS — F17200 Nicotine dependence, unspecified, uncomplicated: Secondary | ICD-10-CM | POA: Insufficient documentation

## 2012-04-01 DIAGNOSIS — B029 Zoster without complications: Secondary | ICD-10-CM | POA: Insufficient documentation

## 2012-04-01 DIAGNOSIS — X58XXXA Exposure to other specified factors, initial encounter: Secondary | ICD-10-CM | POA: Insufficient documentation

## 2012-04-01 MED ORDER — ERYTHROMYCIN 5 MG/GM OP OINT
TOPICAL_OINTMENT | Freq: Once | OPHTHALMIC | Status: AC
  Administered 2012-04-01: 05:00:00 via OPHTHALMIC
  Filled 2012-04-01: qty 3.5

## 2012-04-01 MED ORDER — TETRACAINE HCL 0.5 % OP SOLN
OPHTHALMIC | Status: AC
  Administered 2012-04-01: 05:00:00
  Filled 2012-04-01: qty 2

## 2012-04-01 MED ORDER — HYDROMORPHONE HCL PF 1 MG/ML IJ SOLN
1.0000 mg | Freq: Once | INTRAMUSCULAR | Status: AC
  Administered 2012-04-01: 1 mg via INTRAMUSCULAR
  Filled 2012-04-01: qty 1

## 2012-04-01 MED ORDER — OXYCODONE-ACETAMINOPHEN 5-325 MG PO TABS: 1.0000 | ORAL_TABLET | Freq: Four times a day (QID) | ORAL | Status: DC | PRN

## 2012-04-01 NOTE — ED Provider Notes (Signed)
History     CSN: [id number]  Arrival date & time 04/01/12  9629   First MD Initiated Contact with Patient 04/01/12 0406      Chief Complaint  Patient presents with  . Herpes Zoster    (Consider location/radiation/quality/duration/timing/severity/associated sxs/prior treatment) HPI The patient presents today as after an initial encounter for shingles, now with worsening pain.  He notes that his initial presentation was 36 hours after the development of lesions on his scalp, left side.  He notes that on initial encounter he was provided steroids, antivirals, analgesics.  He is taking all medication as directed.  He notes that his narcotics are working for approximately 30 minutes, then the pain, burning, returns.  He also now complains of worsening pain-like sensation in his left eye, without acute loss, photosensitivity. He denies any new headaches, nausea, vomiting, fever, chills. History reviewed. No pertinent past medical history.  History reviewed. No pertinent past surgical history.  No family history on file.  History  Substance Use Topics  . Smoking status: Current Everyday Smoker -- 0.5 packs/day    Types: Cigarettes  . Smokeless tobacco: Not on file  . Alcohol Use: No      Review of Systems  All other systems reviewed and are negative.    Allergies  Review of patient's allergies indicates no known allergies.  Home Medications   Current Outpatient Rx  Name Route Sig Dispense Refill  . ACYCLOVIR 400 MG PO TABS  One pill 5 times a day x 10 days 50 tablet 0  . GABAPENTIN 300 MG PO CAPS Oral Take 1 capsule (300 mg total) by mouth 3 (three) times daily. 90 capsule 0  . OXYCODONE-ACETAMINOPHEN 5-325 MG PO TABS Oral Take 1 tablet by mouth every 4 (four) hours as needed for pain. 30 tablet 0  . PREDNISONE 50 MG PO TABS  One PO daily 10 tablet 0  . HYDROCODONE-ACETAMINOPHEN 5-325 MG PO TABS  One po q 4-6 hrs prn pain 20 tablet 0    BP 119/97  Pulse 93  Temp 98.2 F  (36.8 C) (Oral)  Ht 5\' 9"  (1.753 m)  SpO2 100%  Physical Exam  Nursing note and vitals reviewed. Constitutional: He appears well-developed and well-nourished.       Awake, alert, nontoxic appearance.  HENT:  Head: Normocephalic and atraumatic.    Right Ear: External ear normal.  Left Ear: External ear normal.  Nose: Nose normal.  Mouth/Throat: Oropharynx is clear and moist.  Eyes: Conjunctivae and EOM are normal. Pupils are equal, round, and reactive to light. Right eye exhibits no discharge. Left eye exhibits no discharge.       Left eye with diffuse fluorescein dye uptake on Woods lamp exam, there is a corneal abrasion on the inferior aspect, approximately half centimeter in diameter  Neck: Neck supple.  Cardiovascular: Normal heart sounds.   Pulmonary/Chest: Effort normal and breath sounds normal. He exhibits no tenderness.  Abdominal: Soft. There is no tenderness. There is no rebound.  Musculoskeletal: He exhibits no tenderness.       Baseline ROM, no obvious new focal weakness.  Neurological: He is alert.       Mental status and motor strength appears baseline for patient and situation.  Skin: No rash noted.       No rash present to scalp. Painful with touch in what would be the opthalmic region of the left upper scalp. Pain does not cross the midline. Currently located at the hairline extending back toward  the crown of the head.   Psychiatric: He has a normal mood and affect.    ED Course  Procedures (including critical care time)  Labs Reviewed - No data to display No results found.   No diagnosis found.    MDM  This generally well male presents for second time this week with concerns of persistent pain 2 to a new foster infection.  On exam the patient is in no distress, though he is in a notable pain.  The patient's eye exam mistreated a corneal abrasion without any evidence of hyphae or pseudohyphae.  Given the patient's preserved acuity, he remains appropriate  for next day ophthalmology followup.  We discussed, at length, the need to see an ophthalmologist as soon as possible for continued management of his possible zoster ophthalmicus.  The absence of abnormal vital signs, other signs of systemic infection his reassuring, and the patient was discharged after increase in his analgesics.    Gerhard Munch, MD 04/01/12 [phone]

## 2012-04-01 NOTE — ED Notes (Signed)
Pt reporting no relief from treatment of shingles.  Reports was seen in department and prescribed Percocet, prednisone, neurontin, and acyclovir.  Denies relief.  Pt reporting burning pain on left side of scalp and in left eye.

## 2012-04-01 NOTE — ED Notes (Signed)
States also having pain in left leg - onset this night

## 2012-04-01 NOTE — ED Notes (Signed)
Diagnosed Here earlier this week with shingles - advised to come back if problems.  C/0 burning and pain

## 2012-04-03 ENCOUNTER — Emergency Department (HOSPITAL_COMMUNITY)
Admission: EM | Admit: 2012-04-03 | Discharge: 2012-04-03 | Disposition: A | Discharge status: Home / Self Care | Payer: Self-pay | Attending: Emergency Medicine | Admitting: Emergency Medicine

## 2012-04-03 ENCOUNTER — Encounter (HOSPITAL_COMMUNITY): Payer: Self-pay

## 2012-04-03 DIAGNOSIS — B029 Zoster without complications: Secondary | ICD-10-CM | POA: Insufficient documentation

## 2012-04-03 DIAGNOSIS — F17200 Nicotine dependence, unspecified, uncomplicated: Secondary | ICD-10-CM | POA: Insufficient documentation

## 2012-04-03 MED ORDER — ACYCLOVIR 400 MG PO TABS: 800.0000 mg | ORAL_TABLET | Freq: Every day | ORAL | Status: AC

## 2012-04-03 MED ORDER — OXYCODONE-ACETAMINOPHEN 5-325 MG PO TABS: 1.0000 | ORAL_TABLET | ORAL | Status: AC | PRN

## 2012-04-03 MED ORDER — ONDANSETRON HCL 4 MG/2ML IJ SOLN
4.0000 mg | Freq: Once | INTRAMUSCULAR | Status: AC
  Administered 2012-04-03: 4 mg via INTRAMUSCULAR
  Filled 2012-04-03: qty 2

## 2012-04-03 MED ORDER — HYDROMORPHONE HCL PF 2 MG/ML IJ SOLN
2.0000 mg | Freq: Once | INTRAMUSCULAR | Status: AC
  Administered 2012-04-03: 2 mg via INTRAMUSCULAR
  Filled 2012-04-03: qty 1

## 2012-04-03 NOTE — ED Notes (Signed)
Advised by Dr Ignacia Palma that he gave pt papers and told him he could go. Did not sign e sign for papers.

## 2012-04-03 NOTE — ED Notes (Signed)
Pt states he cannot handle the pain at home. Pt states he is out of his percocet.

## 2012-04-03 NOTE — ED Notes (Signed)
Pt reports having shingles since Thursday, was seen in er and told to return if not better.  Areas are to head

## 2012-04-03 NOTE — ED Provider Notes (Cosign Needed)
History  This chart was scribed for Carleene Cooper III, MD by Bennett Scrape. This patient was seen in room APA10/APA10 and the patient's care was started at 7:28AM.  CSN: [id number]  Arrival date & time 04/03/12  0711   First MD Initiated Contact with Patient 04/03/12 [phone]      Chief Complaint  Patient presents with  . Herpes Zoster    The history is provided by the patient. No language interpreter was used.    Kristopher Yoder is a 46 y.o. male who presents to the Emergency Department complaining of 4 days of gradual onset, gradually worsening left-sided head pain described as burning, stabbing and throbbing that radiates into the left eye attributed to shingles. Pt was seen in the ED on 03/30/12 and 03/31/12 and discharged home with pain medications. He reports taking oxycodone, percocet, prednisone, neurontin and zovirax (400 mg 5X a day) with no improvement in symptoms. He also reports that he has an ophthalmologist  appointment in 3 days. He denies fever, nausea, emesis, visual disturbance and rash as associated symptoms. He does not have a h/o chronic medical conditions and is a current everyday smoker but denies alcohol use.     History reviewed. No pertinent past medical history.  History reviewed. No pertinent past surgical history.  No family history on file.  History  Substance Use Topics  . Smoking status: Current Everyday Smoker -- 0.5 packs/day    Types: Cigarettes  . Smokeless tobacco: Not on file  . Alcohol Use: No      Review of Systems  Constitutional: Negative for fever.  HENT: Negative for facial swelling.        Left-sided head pain  Eyes: Negative for visual disturbance.  Gastrointestinal: Negative for nausea and vomiting.  Skin: Negative for rash.  All other systems reviewed and are negative.    Allergies  Review of patient's allergies indicates no known allergies.  Home Medications   Current Outpatient Rx  Name Route Sig Dispense Refill  .  ACYCLOVIR 400 MG PO TABS  One pill 5 times a day x 10 days 50 tablet 0  . GABAPENTIN 300 MG PO CAPS Oral Take 1 capsule (300 mg total) by mouth 3 (three) times daily. 90 capsule 0  . OXYCODONE-ACETAMINOPHEN 5-325 MG PO TABS Oral Take 1-2 tablets by mouth every 6 (six) hours as needed for pain. 30 tablet 0  . PREDNISONE 50 MG PO TABS  One PO daily 10 tablet 0    Triage Vitals: BP 152/83  Pulse 98  Temp 98.6 F (37 C) (Oral)  Resp 20  Ht 5\' 9"  (1.753 m)  Wt 180 lb (81.647 kg)  BMI 26.58 kg/m2  SpO2 100%  Physical Exam  Nursing note and vitals reviewed. Constitutional: He is oriented to person, place, and time. He appears well-developed and well-nourished. No distress.  HENT:  Head: Normocephalic and atraumatic.       Great tenderness over the left scalp and left forehead, no lesions, effusion, erythema or ecchymosis noted  Eyes: Conjunctivae and EOM are normal. Pupils are equal, round, and reactive to light.       No lesions on the surface of the eyes noted  Neck: Neck supple. No tracheal deviation present.  Cardiovascular: Normal rate and regular rhythm.   Pulmonary/Chest: Effort normal and breath sounds normal. No respiratory distress.  Musculoskeletal: Normal range of motion. He exhibits no edema.  Neurological: He is alert and oriented to person, place, and time.  Skin: Skin is  warm and dry.  Psychiatric: He has a normal mood and affect. His behavior is normal.    ED Course  Procedures (including critical care time)  DIAGNOSTIC STUDIES: Oxygen Saturation is 100% on room air, normal by my interpretation.    COORDINATION OF CARE: 7:49AM-Discussed treatment plan of pain medication and scheduling an opthalmology appointment with pt at bedside and pt agreed to plan.       1. Herpes zoster       I personally performed the services described in this documentation, which was scribed in my presence. The recorded information has been reviewed and considered.  Kristopher Yoder, M.D.      Carleene Cooper III, MD 04/03/12 [phone]

## 2012-04-03 NOTE — ED Notes (Signed)
Pt c/o having shingles to the head. This is the pt's 3 visit for the same.

## 2012-04-08 ENCOUNTER — Emergency Department (HOSPITAL_COMMUNITY)
Admission: EM | Admit: 2012-04-08 | Discharge: 2012-04-08 | Disposition: A | Discharge status: Home / Self Care | Payer: Self-pay | Attending: Emergency Medicine | Admitting: Emergency Medicine

## 2012-04-08 ENCOUNTER — Encounter (HOSPITAL_COMMUNITY): Payer: Self-pay | Admitting: *Deleted

## 2012-04-08 DIAGNOSIS — F17200 Nicotine dependence, unspecified, uncomplicated: Secondary | ICD-10-CM | POA: Insufficient documentation

## 2012-04-08 DIAGNOSIS — B029 Zoster without complications: Secondary | ICD-10-CM | POA: Insufficient documentation

## 2012-04-08 MED ORDER — OXYCODONE-ACETAMINOPHEN 5-325 MG PO TABS: 1.0000 | ORAL_TABLET | ORAL | Status: AC | PRN

## 2012-04-08 NOTE — ED Provider Notes (Signed)
History   This chart was scribed for Dione Booze, MD by Toya Smothers. The patient was seen in room APA07/APA07. Patient's care was started at 1735.  CSN: [id number]  Arrival date & time 04/08/12  1735   First MD Initiated Contact with Patient 04/08/12 1750      Chief Complaint  Patient presents with  . Herpes Zoster   The history is provided by the patient. No language interpreter was used.    Kristopher Yoder is a 46 y.o. male who is typically healthy at baseline reports to the Emergency Department complaining of moderate rash to the left side of the head and left hip. Pt reports the onset of rash localized to the head one week ago, and the onset of the rash localized on the left hip 1 days ago. Associated pain to both areas is described a severe burning sensation, "unlike any other pain" Pt has ever felt. He was seen in the ED earlier this week and has been taking percocet with mild relief of pain, though he states that he is still unable to sleep. Pt denies emesis, fever, nausea, diarrhea, and dysuria. Pt lists that he is everyday smoker.   History reviewed. No pertinent past medical history.  History reviewed. No pertinent past surgical history.  No family history on file.  History  Substance Use Topics  . Smoking status: Current Everyday Smoker -- 0.5 packs/day    Types: Cigarettes  . Smokeless tobacco: Not on file  . Alcohol Use: No    Review of Systems  Constitutional: Negative for fever and chills.  HENT: Negative for rhinorrhea and neck pain.   Eyes: Negative for pain.  Respiratory: Negative for cough and shortness of breath.   Cardiovascular: Negative for chest pain.  Gastrointestinal: Negative for nausea, vomiting, abdominal pain and diarrhea.  Genitourinary: Negative for dysuria.  Musculoskeletal: Negative for back pain.  Skin: Positive for rash.  Neurological: Negative for dizziness and weakness.  All other systems reviewed and are negative.    Allergies    Review of patient's allergies indicates no known allergies.  Home Medications   Current Outpatient Rx  Name Route Sig Dispense Refill  . ACYCLOVIR 400 MG PO TABS  One pill 5 times a day x 10 days 50 tablet 0  . ACYCLOVIR 400 MG PO TABS Oral Take 2 tablets (800 mg total) by mouth 5 (five) times daily. 50 tablet 0  . GABAPENTIN 300 MG PO CAPS Oral Take 1 capsule (300 mg total) by mouth 3 (three) times daily. 90 capsule 0  . OXYCODONE-ACETAMINOPHEN 5-325 MG PO TABS Oral Take 1-2 tablets by mouth every 6 (six) hours as needed for pain. 30 tablet 0  . OXYCODONE-ACETAMINOPHEN 5-325 MG PO TABS Oral Take 1 tablet by mouth every 4 (four) hours as needed for pain. 20 tablet 0  . OXYCODONE-ACETAMINOPHEN 5-325 MG PO TABS Oral Take 1 tablet by mouth every 4 (four) hours as needed for pain. 20 tablet 0  . PREDNISONE 50 MG PO TABS  One PO daily 10 tablet 0    There were no vitals taken for this visit.  Physical Exam  Constitutional: He is oriented to person, place, and time. He appears well-developed and well-nourished. No distress.  HENT:  Head: Normocephalic and atraumatic.  Mouth/Throat: No oropharyngeal exudate.  Eyes: EOM are normal. Pupils are equal, round, and reactive to light. Right eye exhibits no discharge. Left eye exhibits no discharge. No scleral icterus.  Neck: Normal range of motion. Neck supple.  No tracheal deviation present.  Abdominal: Soft. He exhibits no distension. There is no tenderness.  Musculoskeletal: Normal range of motion. He exhibits no edema and no tenderness.  Lymphadenopathy:    He has no cervical adenopathy.  Neurological: He is alert and oriented to person, place, and time. Coordination normal.  Skin: Skin is warm and dry.       Rash on the left side of the anterior scalp and forehead. Vessicles on erythematous face consistent herpes. Scattered erythematous scapulas on L lower back, lower hi, buttox are unknkown.     ED Course  Procedures (including critical  care time) DIAGNOSTIC STUDIES:  COORDINATION OF CARE: 1805- Evaluated Pt. Pt is awake, alert, and oriented. 1818- Prescribed oxyCODONE-acetaminophen (ROXICET) 5-325 MG per tablet Every 4 hours PRN.   1. Herpes zoster       MDM  Herpes zoster involving the left side of the scalp and 4 head. Am uncertain what the rash is in his bacteria, but does not appear to be herpes zoster. Prior records are reviewed, and he has a prior ED visits for this episode of zoster. He initially was going through 30 Percocet tablets over 2 day period, but this current prescription lasted for 5 days and he was prescribed 20 tablets. This uses not seem to be out of line, and he is given a new prescription for Percocet 20 tablets. It was explained to the patient that his medications other than Percocet were designed to prevent postherpetic neuralgia and not actually treat the current episode of zoster.      I personally performed the services described in this documentation, which was scribed in my presence. The recorded information has been reviewed and considered.      Dione Booze, MD 04/08/12 [phone]

## 2012-04-08 NOTE — ED Notes (Signed)
Pt seen for the same x 4 times and needs pain medication. EDP aware.

## 2012-04-08 NOTE — ED Notes (Signed)
Pt c/o shingles to left side of head and left hip, pt admits to drainage from area on left hip area. Pain started last week

## 2012-04-11 ENCOUNTER — Emergency Department (HOSPITAL_COMMUNITY)
Admission: EM | Admit: 2012-04-11 | Discharge: 2012-04-11 | Disposition: A | Discharge status: Home / Self Care | Payer: Self-pay | Attending: Emergency Medicine | Admitting: Emergency Medicine

## 2012-04-11 ENCOUNTER — Encounter (HOSPITAL_COMMUNITY): Payer: Self-pay | Admitting: Emergency Medicine

## 2012-04-11 DIAGNOSIS — F17200 Nicotine dependence, unspecified, uncomplicated: Secondary | ICD-10-CM | POA: Insufficient documentation

## 2012-04-11 DIAGNOSIS — B029 Zoster without complications: Secondary | ICD-10-CM | POA: Insufficient documentation

## 2012-04-11 MED ORDER — HYDROMORPHONE HCL PF 1 MG/ML IJ SOLN
1.0000 mg | Freq: Once | INTRAMUSCULAR | Status: AC
  Administered 2012-04-11: 1 mg via INTRAMUSCULAR
  Filled 2012-04-11: qty 1

## 2012-04-11 MED ORDER — HYDROMORPHONE HCL 2 MG PO TABS: 2.0000 mg | ORAL_TABLET | ORAL | Status: AC | PRN

## 2012-04-11 NOTE — ED Provider Notes (Cosign Needed)
History   This chart was scribed for Benny Lennert, MD by Charolett Bumpers . The patient was seen in room APA07/APA07. Patient's care was started at 0710.    CSN: [id number]  Arrival date & time 04/11/12  4098   First MD Initiated Contact with Patient 04/11/12 0710      Chief Complaint  Patient presents with  . Herpes Zoster    History of shingles - out of pain medication    (Consider location/radiation/quality/duration/timing/severity/associated sxs/prior treatment) HPI Comments: Kristopher Yoder is a 46 y.o. male who presents to the Emergency Department complaining of constant, moderate rash to the left side of his head for the past week. Pt was previously seen for the rash here in ED, and dx with Herpes Zoster. Pt states that he was started on Acyclovir, Neurontin and Percocet for the associated pain he is experiencing with the rash. Pt describes the associated pain as a burning, stinging, throbbing sensation. Pt also reports associated left ear and eye pain. Pt reports that he also applies azithromycin suave causing his vision to be slightly blurry. Pt states that he is out of Percocet. Pt reports some relief with the percocet, but reports it only lasts 30 minutes. Pt reports that the rash is improving, but the pain is not.   Patient is a 46 y.o. male presenting with rash. The history is provided by the patient. No language interpreter was used.  Rash  This is a new problem. The current episode started more than 1 week ago. The problem has been gradually improving. The problem is associated with nothing. There has been no fever. The fever has been present for 5 days or more. The rash is present on the scalp, head and face. The pain is at a severity of 9/10. The pain is moderate. The pain has been constant since onset. Associated symptoms include pain. Treatments tried: percocet. The treatment provided moderate relief.     History reviewed. No pertinent past medical  history.  History reviewed. No pertinent past surgical history.  No family history on file.  History  Substance Use Topics  . Smoking status: Current Everyday Smoker -- 0.5 packs/day    Types: Cigarettes  . Smokeless tobacco: Not on file  . Alcohol Use: No      Review of Systems  Constitutional: Negative for fatigue.  HENT: Negative for congestion, sinus pressure and ear discharge.   Eyes: Negative for discharge.  Respiratory: Negative for cough.   Cardiovascular: Negative for chest pain.  Gastrointestinal: Negative for abdominal pain and diarrhea.  Genitourinary: Negative for frequency and hematuria.  Musculoskeletal: Negative for back pain.  Skin: Positive for rash.  Neurological: Positive for headaches. Negative for seizures.  Hematological: Negative.   Psychiatric/Behavioral: Negative for hallucinations.   A complete 10 system review of systems was obtained and all systems are negative except as noted in the HPI and PMH.   Allergies  Review of patient's allergies indicates no known allergies.  Home Medications   Current Outpatient Rx  Name Route Sig Dispense Refill  . ACYCLOVIR 400 MG PO TABS Oral Take 2 tablets (800 mg total) by mouth 5 (five) times daily. 50 tablet 0  . B COMPLEX-C PO TABS Oral Take 1 tablet by mouth daily.     Marland Kitchen GABAPENTIN 300 MG PO CAPS Oral Take 1 capsule (300 mg total) by mouth 3 (three) times daily. 90 capsule 0  . OXYCODONE-ACETAMINOPHEN 5-325 MG PO TABS Oral Take 1 tablet by mouth every 4 (  four) hours as needed for pain. 20 tablet 0  . OXYCODONE-ACETAMINOPHEN 5-325 MG PO TABS Oral Take 1 tablet by mouth every 4 (four) hours as needed for pain. 20 tablet 0    BP 137/78  Pulse 109  Temp 98.5 F (36.9 C) (Oral)  Resp 18  Ht 5\' 9"  (1.753 m)  Wt 180 lb (81.647 kg)  BMI 26.58 kg/m2  SpO2 100%  Physical Exam  Constitutional: He is oriented to person, place, and time. He appears well-developed.  HENT:  Head: Normocephalic and  atraumatic.  Eyes: Conjunctivae and EOM are normal. No scleral icterus.  Neck: Neck supple. No thyromegaly present.  Cardiovascular: Normal rate and regular rhythm.  Exam reveals no gallop and no friction rub.   No murmur heard. Pulmonary/Chest: No stridor. He has no wheezes. He has no rales. He exhibits no tenderness.  Abdominal: He exhibits no distension. There is no tenderness. There is no rebound.  Musculoskeletal: Normal range of motion. He exhibits no edema.  Lymphadenopathy:    He has no cervical adenopathy.  Neurological: He is oriented to person, place, and time. Coordination normal.  Skin: Rash noted. Rash is maculopapular. No erythema.       Few maculopapulars on left side of occipital head.  Psychiatric: He has a normal mood and affect. His behavior is normal.    ED Course  Procedures (including critical care time)  DIAGNOSTIC STUDIES: Oxygen Saturation is 100% on room air, normal by my interpretation.    COORDINATION OF CARE:  07:16-Discussed planned course of treatment with the patient including continuing with the acyclovir and neurontin, will give new prescription for pain medication, who is agreeable at this time.     Labs Reviewed - No data to display No results found.   No diagnosis found.    MDM     The chart was scribed for me under my direct supervision.  I personally performed the history, physical, and medical decision making and all procedures in the evaluation of this patient.Benny Lennert, MD 04/11/12 [phone]

## 2012-04-14 ENCOUNTER — Encounter (HOSPITAL_COMMUNITY): Payer: Self-pay | Admitting: Emergency Medicine

## 2012-04-14 ENCOUNTER — Emergency Department (HOSPITAL_COMMUNITY)
Admission: EM | Admit: 2012-04-14 | Discharge: 2012-04-14 | Disposition: A | Discharge status: Home / Self Care | Payer: Self-pay | Attending: Emergency Medicine | Admitting: Emergency Medicine

## 2012-04-14 DIAGNOSIS — B029 Zoster without complications: Secondary | ICD-10-CM | POA: Insufficient documentation

## 2012-04-14 NOTE — ED Provider Notes (Signed)
History     CSN: [id number]  Arrival date & time 04/14/12  1191   First MD Initiated Contact with Patient 04/14/12 [phone]      Chief Complaint  Patient presents with  . Herpes Zoster     HPI Pt was seen at 0845.  Per pt, c/o gradual onset and persistence of constant left sided head "pain" that began 2 to 3 weeks ago.  Pt has been eval in the ED 6 times for same, dx shingles, rx acyclovir, neurontin, percocet and dilaudid.  Pt confirms he has been taking the dilaudid for his pain but states he "needs something stronger."  Pt describes his pain as "burning" and "stinging."  Endorses his rash has been improving but not the pain.  Denies new rash, no fevers, no eye pain/visual changes, no CP/SOB, no abd pain, no N/V/D.    History reviewed. No pertinent past medical history.  History reviewed. No pertinent past surgical history.   History  Substance Use Topics  . Smoking status: Current Everyday Smoker -- 0.5 packs/day    Types: Cigarettes  . Smokeless tobacco: Not on file  . Alcohol Use: No     Review of Systems ROS: Statement: All systems negative except as marked or noted in the HPI; Constitutional: Negative for fever and chills. ; ; Eyes: Negative for eye pain, redness and discharge. ; ; ENMT: Negative for ear pain, hoarseness, nasal congestion, sinus pressure and sore throat. ; ; Cardiovascular: Negative for chest pain, palpitations, diaphoresis, dyspnea and peripheral edema. ; ; Respiratory: Negative for cough, wheezing and stridor. ; ; Gastrointestinal: Negative for nausea, vomiting, diarrhea, abdominal pain, blood in stool, hematemesis, jaundice and rectal bleeding. . ; ; Genitourinary: Negative for dysuria, flank pain and hematuria. ; ; Musculoskeletal: Negative for back pain and neck pain. Negative for swelling and trauma.; ; Skin: +improving rash. Negative for pruritus, abrasions, blisters, bruising and skin lesion.; ; Neuro: Negative for headache, lightheadedness and neck  stiffness. Negative for weakness, altered level of consciousness , altered mental status, extremity weakness, paresthesias, involuntary movement, seizure and syncope.       Allergies  Review of patient's allergies indicates no known allergies.  Home Medications   Current Outpatient Rx  Name Route Sig Dispense Refill  . ACYCLOVIR 400 MG PO TABS Oral Take 2 tablets (800 mg total) by mouth 5 (five) times daily. 50 tablet 0  . B COMPLEX-C PO TABS Oral Take 1 tablet by mouth daily.     Marland Kitchen GABAPENTIN 300 MG PO CAPS Oral Take 1 capsule (300 mg total) by mouth 3 (three) times daily. 90 capsule 0  . HYDROMORPHONE HCL 2 MG PO TABS Oral Take 1 tablet (2 mg total) by mouth every 4 (four) hours as needed for pain. 30 tablet 0  . OXYCODONE-ACETAMINOPHEN 5-325 MG PO TABS Oral Take 1 tablet by mouth every 4 (four) hours as needed for pain. 20 tablet 0  . OXYCODONE-ACETAMINOPHEN 5-325 MG PO TABS Oral Take 1 tablet by mouth every 4 (four) hours as needed for pain. 20 tablet 0    BP 117/86  Pulse 106  Temp 99.1 F (37.3 C) (Oral)  Resp 20  Ht 5\' 9"  (1.753 m)  Wt 180 lb (81.647 kg)  BMI 26.58 kg/m2  SpO2 98%  Physical Exam 0850: Physical examination:  Nursing notes reviewed; Vital signs and O2 SAT reviewed;  Constitutional: Well developed, Well nourished, Well hydrated, In no acute distress; Head:  Normocephalic, atraumatic; Eyes: EOMI, PERRL, No scleral icterus;  ENMT: Mouth and pharynx normal, Mucous membranes moist; Neck: Supple, Full range of motion, No lymphadenopathy; Cardiovascular: Regular rate and rhythm, No gallop; Respiratory: Breath sounds clear & equal bilaterally, No wheezes.  Speaking full sentences with ease, Normal respiratory effort/excursion; Chest: Nontender, Movement normal; Abdomen: Soft, Nontender, Nondistended, Normal bowel sounds;; Extremities: Pulses normal, No tenderness, No edema, No calf edema or asymmetry.; Neuro: AA&Ox3, Major CN grossly intact.  Speech clear. Gait steady. No  gross focal motor or sensory deficits in extremities.; Skin: Color normal, Warm, Dry; +few erythematous macules left occipital scalp, no lesions to vertex scalp, forehead or face.   ED Course  Procedures    MDM  MDM Reviewed: previous chart, nursing note and vitals     0850:  [place] Controlled Substance Database accessed:  No rx.  [place]MP [place] accessed:  Pt with 3 percocet rx written by 2 different providers on 8/1, 8/3 and 8/5.  EPIC charts reviewed:  Pt has been to the ED 6 times for this complaint with multiple rx for percocet and dilaudid.  2 narcotic rx from 8/10 and 8/13 not in either database.  Rx percocet #20 and dilaudid #30 (rx 3 days ago).  Pt states that "it's not working" and he wants "something stronger."  Informed the rx for dilaudid he has is for 5 days and that I will increase his dose of neurontin for his pain today.  Pt immediately walked out of the exam room and the ED.       Laray Anger, DO 04/15/12 1717

## 2012-04-14 NOTE — ED Notes (Signed)
Pt c/o shingle pain to the head. Pt is out of pain meds.

## 2012-04-14 NOTE — ED Notes (Signed)
Pt in with physician at this time. Pt requesting pain medication stronger than dialudid tablets that he was prescribed a few days ago. "They don't work." pt angry and left without signing discharge papers.

## 2012-05-05 ENCOUNTER — Emergency Department (HOSPITAL_COMMUNITY)
Admission: EM | Admit: 2012-05-05 | Discharge: 2012-05-05 | Disposition: A | Discharge status: Home / Self Care | Payer: Self-pay | Attending: Emergency Medicine | Admitting: Emergency Medicine

## 2012-05-05 ENCOUNTER — Encounter (HOSPITAL_COMMUNITY): Payer: Self-pay | Admitting: Emergency Medicine

## 2012-05-05 DIAGNOSIS — L739 Follicular disorder, unspecified: Secondary | ICD-10-CM

## 2012-05-05 DIAGNOSIS — F17200 Nicotine dependence, unspecified, uncomplicated: Secondary | ICD-10-CM | POA: Insufficient documentation

## 2012-05-05 DIAGNOSIS — L738 Other specified follicular disorders: Secondary | ICD-10-CM | POA: Insufficient documentation

## 2012-05-05 HISTORY — DX: Zoster without complications: B02.9

## 2012-05-05 MED ORDER — NAPROXEN 500 MG PO TABS: 500.0000 mg | ORAL_TABLET | Freq: Two times a day (BID) | ORAL | Status: AC

## 2012-05-05 MED ORDER — DOXYCYCLINE HYCLATE 100 MG PO CAPS: 100.0000 mg | ORAL_CAPSULE | Freq: Two times a day (BID) | ORAL | Status: AC

## 2012-05-05 NOTE — ED Notes (Signed)
Patient states he was treated here approximately 1 1/2 weeks ago for shingles. States he has finished all medication and is still complaining of itching and pain in top of head.

## 2012-05-05 NOTE — ED Provider Notes (Signed)
History     CSN: [id number]  Arrival date & time 05/05/12  0608   First MD Initiated Contact with Patient 05/05/12 [phone]      Chief Complaint  Patient presents with  . Herpes Zoster    (Consider location/radiation/quality/duration/timing/severity/associated sxs/prior treatment) The history is provided by the patient.  the patient is a 46 year old male presents with persistent shingle pain since the beginning of August. Patient has been seen multiple times since then proximally been seen every 2-4 days up until August 16. Patient is a primary care doctor. Patient states that now he does have pain on the top of his scalp and one area no further blistering or skin lesions.  Past Medical History  Diagnosis Date  . Shingles     History reviewed. No pertinent past surgical history.  History reviewed. No pertinent family history.  History  Substance Use Topics  . Smoking status: Current Everyday Smoker -- 0.5 packs/day    Types: Cigarettes  . Smokeless tobacco: Not on file  . Alcohol Use: No      Review of Systems  Constitutional: Negative for fever.  HENT: Negative for neck pain.   Eyes: Negative for visual disturbance.  Respiratory: Negative for shortness of breath.   Cardiovascular: Negative for chest pain.  Gastrointestinal: Negative for abdominal pain.  Genitourinary: Negative for dysuria.  Musculoskeletal: Negative for back pain.  Skin: Positive for rash.  Neurological: Positive for headaches.  Hematological: Does not bruise/bleed easily.    Allergies  Review of patient's allergies indicates no known allergies.  Home Medications   Current Outpatient Rx  Name Route Sig Dispense Refill  . B COMPLEX-C PO TABS Oral Take 1 tablet by mouth daily.     Marland Kitchen DOXYCYCLINE HYCLATE 100 MG PO CAPS Oral Take 1 capsule (100 mg total) by mouth 2 (two) times daily. 14 capsule 0  . GABAPENTIN 300 MG PO CAPS Oral Take 1 capsule (300 mg total) by mouth 3 (three) times daily. 90  capsule 0  . NAPROXEN 500 MG PO TABS Oral Take 1 tablet (500 mg total) by mouth 2 (two) times daily. 14 tablet 0    BP 148/82  Pulse 82  Temp 98.4 F (36.9 C) (Oral)  Resp 18  Ht 5\' 9"  (1.753 m)  Wt 180 lb (81.647 kg)  BMI 26.58 kg/m2  SpO2 100%  Physical Exam  Nursing note and vitals reviewed. Constitutional: He is oriented to person, place, and time. He appears well-developed and well-nourished. No distress.  HENT:  Head: Normocephalic and atraumatic.  Mouth/Throat: Oropharynx is clear and moist.       Top of scalp on the left side with eye area of erythema measuring about 3 cm with a small pustule about 2 mm in the center no distinct deep abscess this is most consistent with like a folliculitis or small boil. No lesions on the scalp consistent with shingles.  Eyes: Conjunctivae and EOM are normal. Pupils are equal, round, and reactive to light.  Neck: Normal range of motion. Neck supple.  Cardiovascular: Normal rate, regular rhythm and normal heart sounds.   Pulmonary/Chest: Effort normal and breath sounds normal.  Abdominal: Soft. Bowel sounds are normal. There is no tenderness.  Musculoskeletal: Normal range of motion.  Lymphadenopathy:    He has no cervical adenopathy.  Neurological: He is alert and oriented to person, place, and time. No cranial nerve deficit. He exhibits normal muscle tone. Coordination normal.  Skin: Skin is warm. No rash noted.  ED Course  Procedures (including critical care time)  Labs Reviewed - No data to display No results found.   1. Folliculitis       MDM  No further evidence of herpes zoster. Patient has some folliculitis on the top part of the scalp with small pustules. Will treat with antibiotics in the Naprosyn. Review of patient's previous visits although originally probably did have a shingles infection the beginning of August since that time patient has been seen about every 2-4 days and has received prescriptions for narcotic  pain medicine at least 20 tablets all way through August 13. Patient was also seen on August 16, was not given any further pain medicine. I doubt the patient has a significant postherpetic neuropathy at this time. Patient points to the pain only being at the area where the folliculitis is.        Shelda Jakes, MD 05/05/12 [phone]

## 2012-05-05 NOTE — ED Notes (Signed)
Pt seen here & tx for shingles. States a new set started on head. Out of all medication.

## 2012-05-05 NOTE — ED Notes (Signed)
RN at bedside

## 2012-05-05 NOTE — ED Notes (Signed)
Pt evaluated by EDP prior to nursing assessment. 

## 2013-03-08 ENCOUNTER — Encounter (HOSPITAL_BASED_OUTPATIENT_CLINIC_OR_DEPARTMENT_OTHER): Payer: Self-pay | Admitting: *Deleted

## 2013-03-08 ENCOUNTER — Emergency Department (HOSPITAL_BASED_OUTPATIENT_CLINIC_OR_DEPARTMENT_OTHER)
Admission: EM | Admit: 2013-03-08 | Discharge: 2013-03-08 | Disposition: A | Discharge status: Home / Self Care | Payer: Self-pay | Attending: Emergency Medicine | Admitting: Emergency Medicine

## 2013-03-08 DIAGNOSIS — F17200 Nicotine dependence, unspecified, uncomplicated: Secondary | ICD-10-CM | POA: Insufficient documentation

## 2013-03-08 DIAGNOSIS — Z791 Long term (current) use of non-steroidal anti-inflammatories (NSAID): Secondary | ICD-10-CM | POA: Insufficient documentation

## 2013-03-08 DIAGNOSIS — J3489 Other specified disorders of nose and nasal sinuses: Secondary | ICD-10-CM | POA: Insufficient documentation

## 2013-03-08 DIAGNOSIS — Z8619 Personal history of other infectious and parasitic diseases: Secondary | ICD-10-CM | POA: Insufficient documentation

## 2013-03-08 DIAGNOSIS — J029 Acute pharyngitis, unspecified: Secondary | ICD-10-CM | POA: Insufficient documentation

## 2013-03-08 DIAGNOSIS — Z79899 Other long term (current) drug therapy: Secondary | ICD-10-CM | POA: Insufficient documentation

## 2013-03-08 NOTE — ED Notes (Signed)
Pt amb to room 5 with quick steady gait in nad. Pt reports dx of shingles several weeks ago at [facility] er, given "a few days" of anit virals, did not help, pt returned to [facility] er and was told he had folliculitis. Pt does not think that dx is correct, cont with pain to head. No lesions or rash noted. Pt also reports left sided sore throat x 2 days, denies any fevers.

## 2013-03-08 NOTE — ED Provider Notes (Signed)
History    CSN: [id number] Arrival date & time 03/08/13  1004  First MD Initiated Contact with Patient 03/08/13 1022     Chief Complaint  Patient presents with  . Herpes Zoster  . Sore Throat   (Consider location/radiation/quality/duration/timing/severity/associated sxs/prior Treatment) HPI Comments: Patient presents with a sore throat. He states it's been going on for 2 or 3 days now. He states it hurts when he swallows. He denies any changes which are difficulty swallowing. He denies any known fevers. He has some mild nasal congestion but no significant drainage. He denies any cough or shortness of breath. He also complains of pain to his scalp. He states that he was diagnosed with shingles a couple of months ago and has had ongoing pain since that time. When I tried to clarify that time he states that it started about 2 or 3 months ago. On review of his records I noted that he was treated for shingles in August of 2013. When questioning him about this he does admit that it's been about a year since he was diagnosed with shingles. He complains of ongoing pain to this area but no sores or current blisters. He currently does not have a primary care provider.  Patient is a 47 y.o. male presenting with pharyngitis.  Sore Throat Pertinent negatives include no chest pain, no abdominal pain, no headaches and no shortness of breath.   Past Medical History  Diagnosis Date  . Shingles    History reviewed. No pertinent past surgical history. History reviewed. No pertinent family history. History  Substance Use Topics  . Smoking status: Current Every Day Smoker -- 0.50 packs/day    Types: Cigarettes  . Smokeless tobacco: Not on file  . Alcohol Use: No    Review of Systems  Constitutional: Negative for fever, chills, diaphoresis and fatigue.  HENT: Positive for congestion and sore throat. Negative for rhinorrhea and sneezing.   Eyes: Negative.   Respiratory: Negative for cough, chest  tightness and shortness of breath.   Cardiovascular: Negative for chest pain and leg swelling.  Gastrointestinal: Negative for nausea, vomiting, abdominal pain, diarrhea and blood in stool.  Genitourinary: Negative for frequency, hematuria, flank pain and difficulty urinating.  Musculoskeletal: Negative for back pain and arthralgias.  Skin: Negative for rash.  Neurological: Negative for dizziness, speech difficulty, weakness, numbness and headaches.    Allergies  Review of patient's allergies indicates no known allergies.  Home Medications   Current Outpatient Rx  Name  Route  Sig  Dispense  Refill  . B Complex-C (B-COMPLEX WITH VITAMIN C) tablet   Oral   Take 1 tablet by mouth daily.          Marland Kitchen gabapentin (NEURONTIN) 300 MG capsule   Oral   Take 1 capsule (300 mg total) by mouth 3 (three) times daily.   90 capsule   0   . naproxen (NAPROSYN) 500 MG tablet   Oral   Take 1 tablet (500 mg total) by mouth 2 (two) times daily.   14 tablet   0    BP 131/79  Pulse 94  Temp(Src) 98.1 F (36.7 C) (Oral)  Resp 18  SpO2 100% Physical Exam  Constitutional: He is oriented to person, place, and time. He appears well-developed and well-nourished.  HENT:  Head: Normocephalic and atraumatic.  Right Ear: External ear normal.  Left Ear: External ear normal.  Nose: Nose normal.  Mouth/Throat: Oropharynx is clear and moist. No oropharyngeal exudate.  . Minimal  erythema with some cobblestoning in the posterior pharynx. No erythema or exudates.  Uvula is midline  Eyes: Pupils are equal, round, and reactive to light.  Neck: Normal range of motion. Neck supple.  Cardiovascular: Normal rate, regular rhythm and normal heart sounds.   Pulmonary/Chest: Effort normal and breath sounds normal. No respiratory distress. He has no wheezes. He has no rales. He exhibits no tenderness.  Abdominal: Soft. Bowel sounds are normal. There is no tenderness. There is no rebound and no guarding.   Musculoskeletal: Normal range of motion. He exhibits no edema.  Lymphadenopathy:    He has no cervical adenopathy.  Neurological: He is alert and oriented to person, place, and time.  Skin: Skin is warm and dry. No rash noted.  I don't note any blisters or sores to his scalp area  Psychiatric: He has a normal mood and affect.    ED Course  Procedures (including critical care time) Labs Reviewed  RAPID STREP SCREEN   No results found. 1. Sore throat     MDM  I reviewed the patient's records and he had been given prescriptions for Percocet and dilaudid  over 6 ED visits in August of 2013 for this shingles episode. I advised the patient that we would check out his throat and make sure that he didn't have strep throat. I also advised them that we would not be able to prescribe narcotics for his neuralgia. I advised him that this would be inappropriate to do from the emergency room and he would need to get a primary care provider or be referred to a pain clinic. I advised him that we would give him some options for possible primary care providers. He seemed amenable to this. However when the nurse went back in the room to collect his rapid strep he told her that the doctor was horrible and he walked out of the room and out of the ED.  I suspect drug seeking behavior.  Rolan Bucco, MD 03/08/13 1040

## 2013-03-08 NOTE — ED Notes (Signed)
This rn enters room to obtain rapid strep screen, pt agitated, states "this is ridiculous, I need something for my pain. That doctor was horrible, just horrible!" pt runs out of ER doors.

## 2013-07-02 IMAGING — CR DG RIBS W/ CHEST 3+V*R*
4 series · 4 of 4 positions shown · non-contrast
Comparison: 12/18/2011

CLINICAL DATA: Fell.  Right-sided chest and rib pain.

RIGHT RIBS AND CHEST - 3+ VIEW

[view not recorded (1 of 4)]
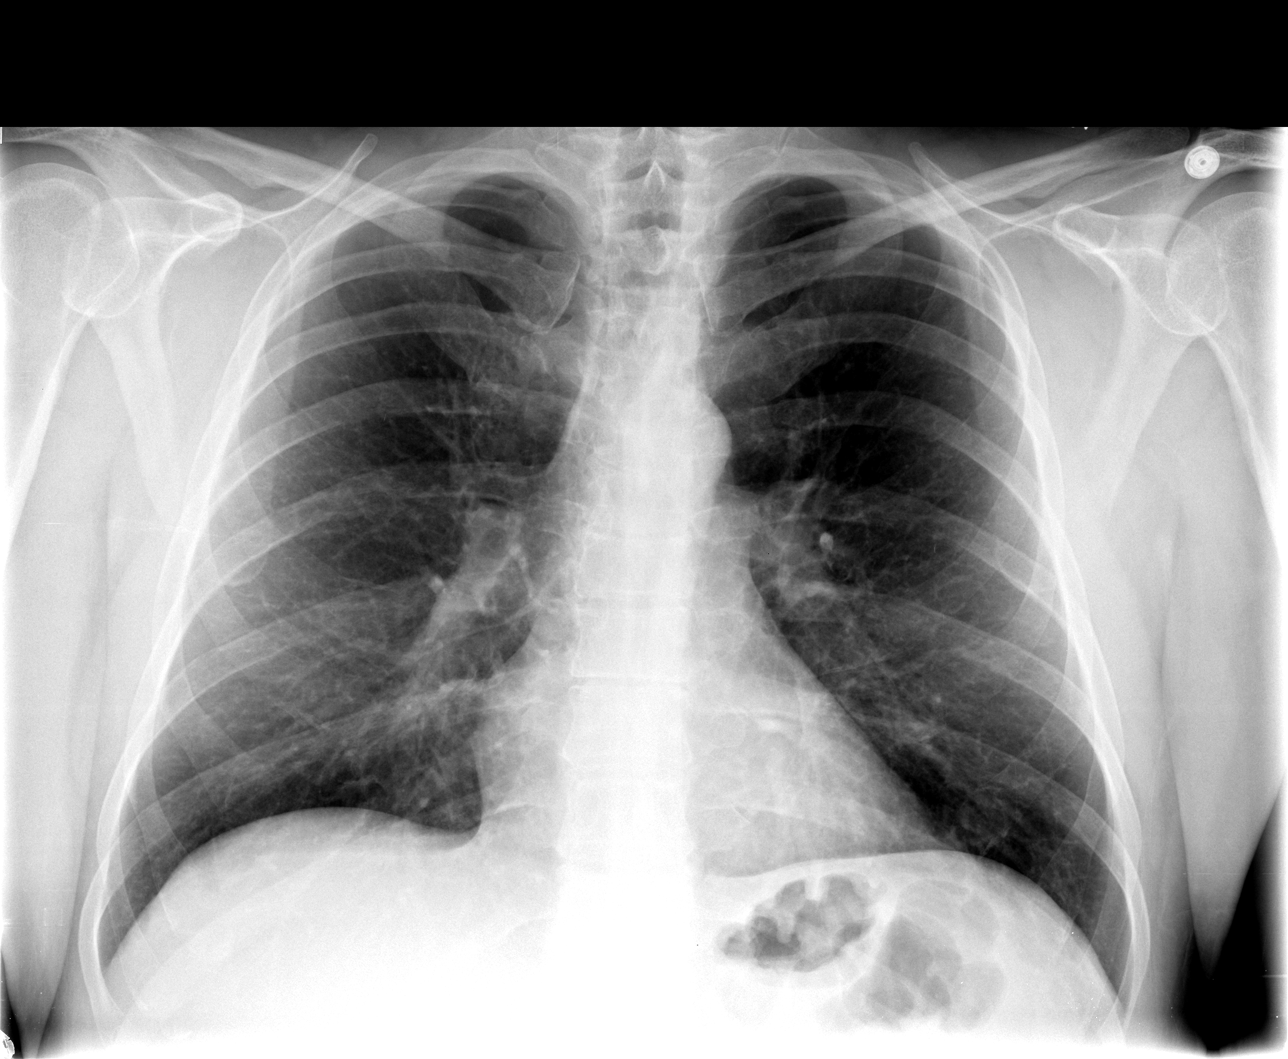

[view not recorded (2 of 4)]
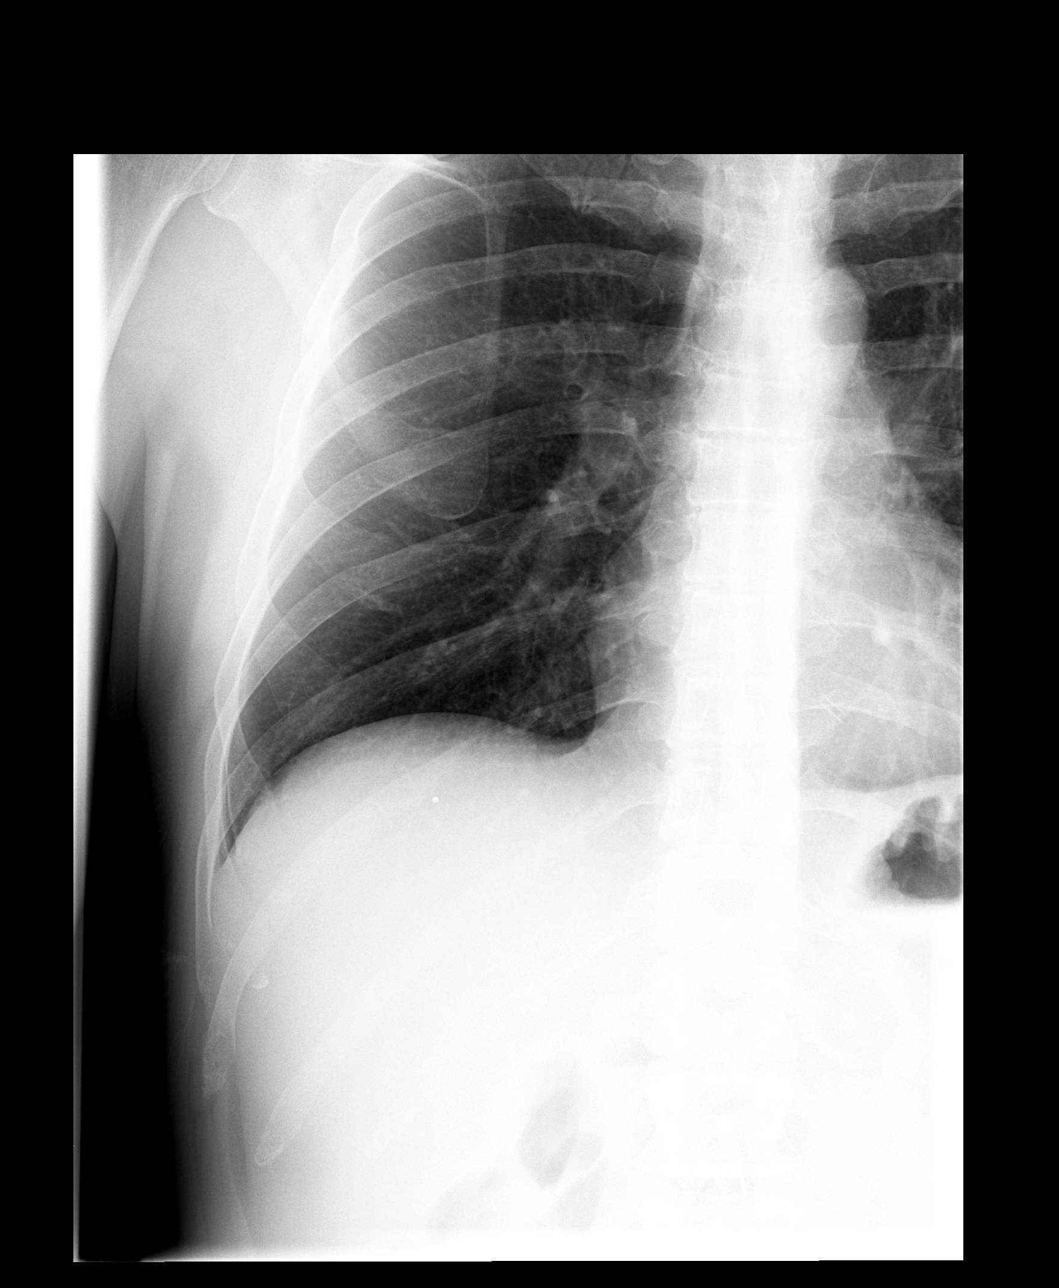

[view not recorded (3 of 4)]
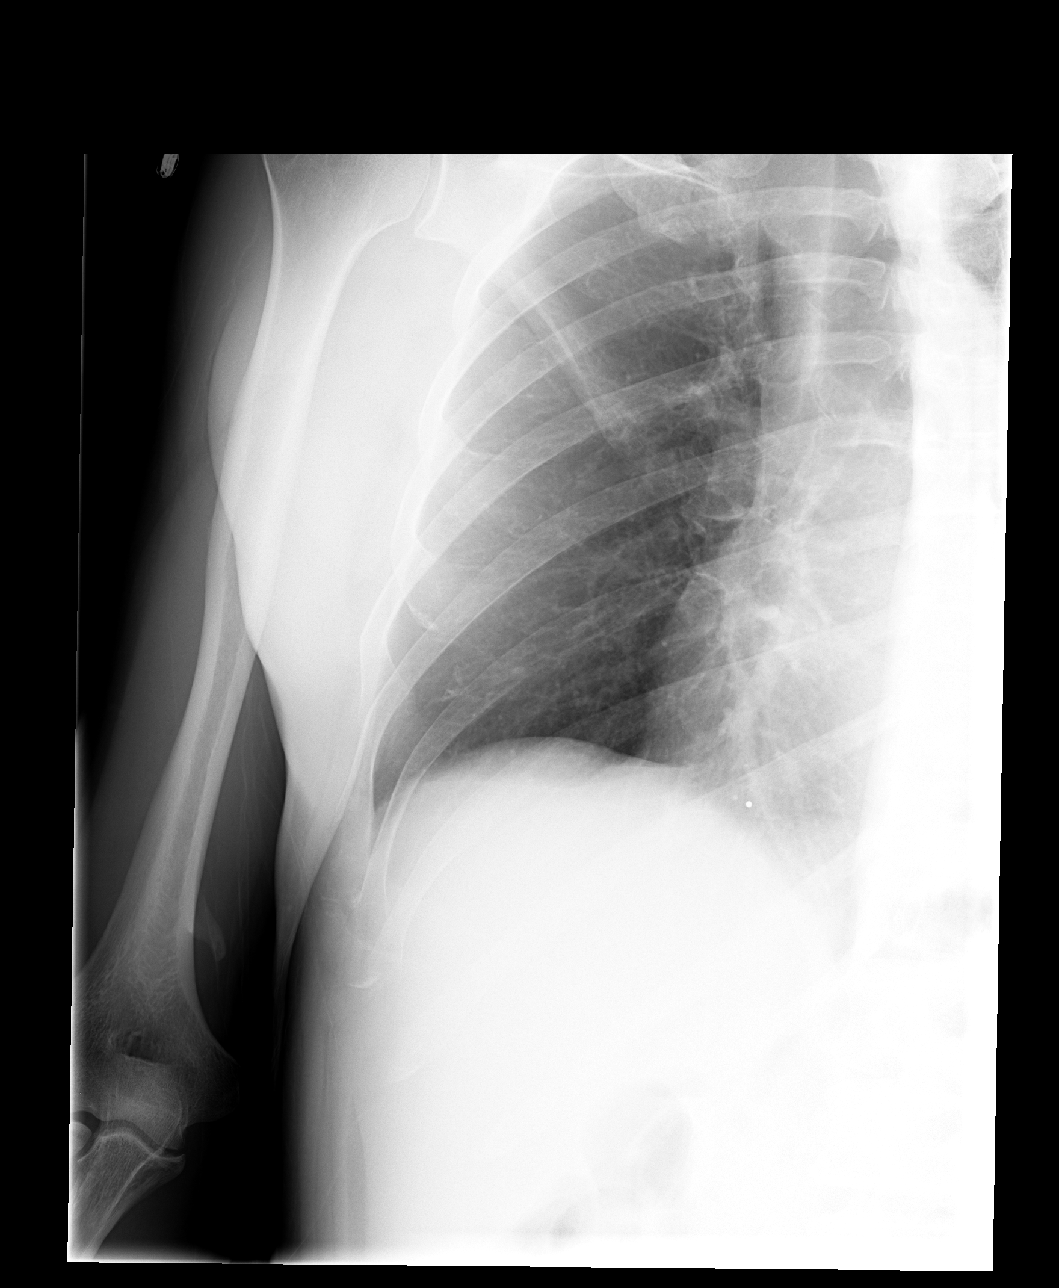

[view not recorded (4 of 4)]
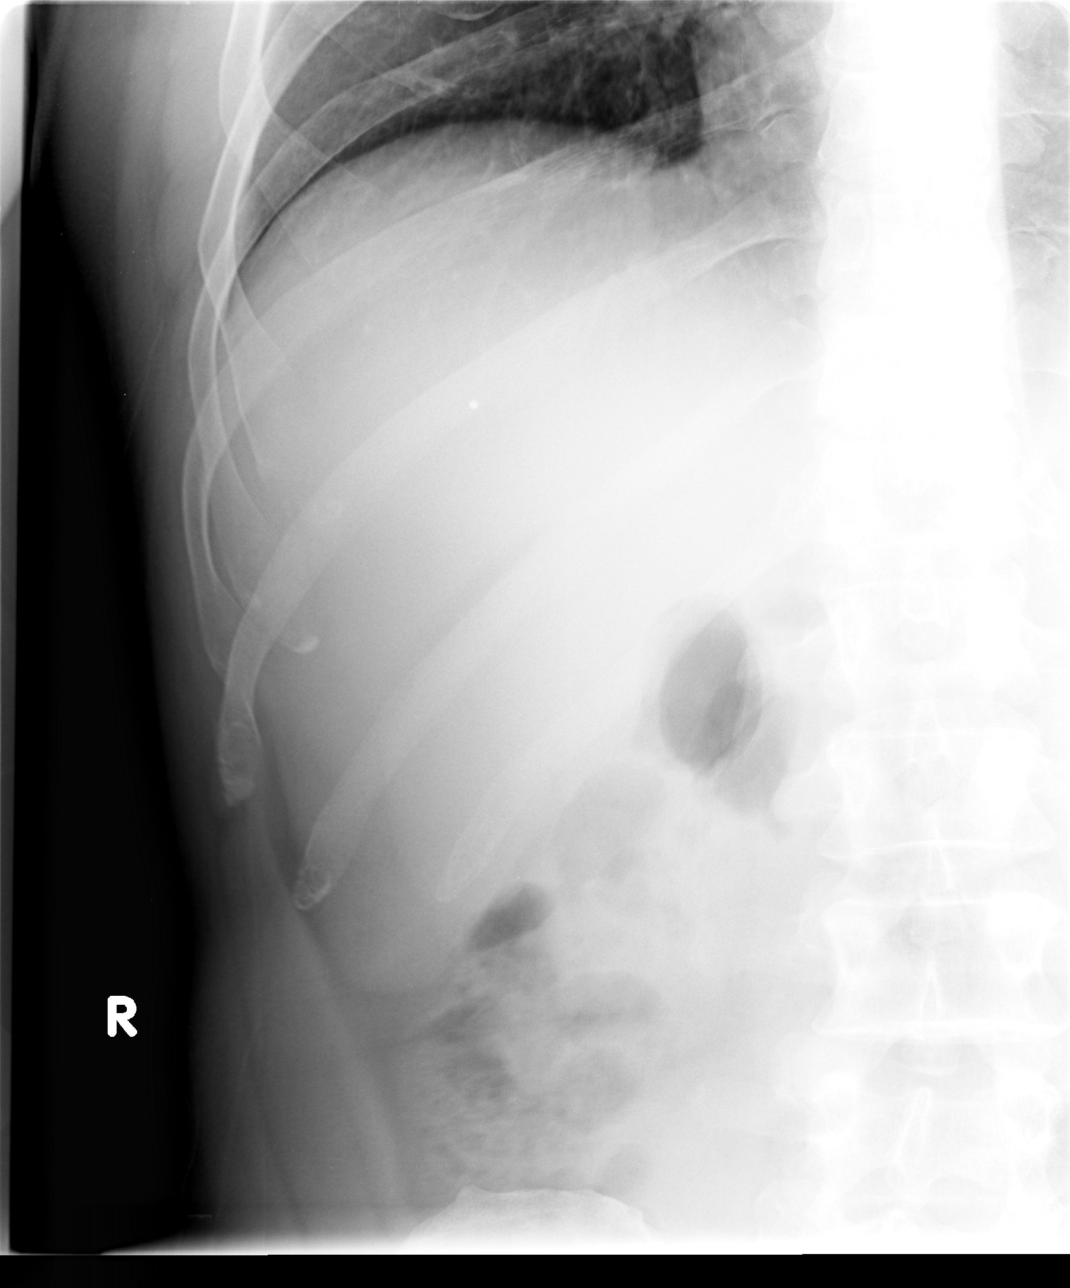

[4 of 4 positions shown; findings below may reference images not displayed]

FINDINGS: Pain heart size is normal.  Mediastinal shadows are
normal.  Lungs are clear.  No pneumothorax pneumothorax.

Right rib details do not show a fracture.
IMPRESSION: No visible rib fracture.  No active cardiopulmonary disease.

## 2021-11-04 ENCOUNTER — Ambulatory Visit (INDEPENDENT_AMBULATORY_CARE_PROVIDER_SITE_OTHER): Payer: 59 | Admitting: Family Medicine

## 2021-11-04 ENCOUNTER — Encounter: Payer: Self-pay | Admitting: Family Medicine

## 2021-11-04 VITALS — BP 117/63 | HR 82 | Ht 69.0 in | Wt 159.0 lb

## 2021-11-04 DIAGNOSIS — Z1211 Encounter for screening for malignant neoplasm of colon: Secondary | ICD-10-CM

## 2021-11-04 DIAGNOSIS — Z114 Encounter for screening for human immunodeficiency virus [HIV]: Secondary | ICD-10-CM

## 2021-11-04 DIAGNOSIS — Z1159 Encounter for screening for other viral diseases: Secondary | ICD-10-CM

## 2021-11-04 DIAGNOSIS — B0229 Other postherpetic nervous system involvement: Secondary | ICD-10-CM

## 2021-11-04 DIAGNOSIS — F1920 Other psychoactive substance dependence, uncomplicated: Secondary | ICD-10-CM

## 2021-11-04 DIAGNOSIS — R252 Cramp and spasm: Secondary | ICD-10-CM

## 2021-11-04 DIAGNOSIS — R7989 Other specified abnormal findings of blood chemistry: Secondary | ICD-10-CM

## 2021-11-04 MED ORDER — GABAPENTIN 300 MG PO CAPS: 300.0000 mg | ORAL_CAPSULE | Freq: Four times a day (QID) | ORAL | 0 refills | Status: DC

## 2021-11-04 NOTE — Patient Instructions (Signed)
Postherpetic Neuralgia: ?Continue gabapentin - refilled today  ?Try lidocaine patches to the area ?

## 2021-11-04 NOTE — Progress Notes (Signed)
? ?______________________________________________________________________ ? ?HPI ?Kristopher Yoder is a 56 y.o. male presenting to [facility] today to establish care. He goes to the methadone clinic for methadone 130 mg daily and Norco 7.5-325 BID - no records in PDMP to confirm. Originally was started on narcotics for back pain.  ? ?Patient Care Team: ?Clayborne Dana, NP as PCP - General (Family Medicine) ? ?Health Maintenance  ?Topic Date Due  ? Hepatitis C Screening: USPSTF Recommendation to screen - Ages 37-79 yo.  Never done  ? Colon Cancer Screening  Never done  ? Zoster (Shingles) Vaccine (1 of 2) Never done  ? Tetanus Vaccine  07/08/2021  ? COVID-19 Vaccine (2 - Pfizer series) 10/30/2021  ? Flu Shot  Completed  ? HIV Screening  Completed  ? HPV Vaccine  Aged Out  ? ? ? ?Concerns today: ?Right rib pain- sharp/stabbing pain 7/10 to right lateral ribs (feels more superficial), keeps him up at night, started up about 2 weeks ago, deep breaths, palpation, lying on right side also hurts, no chest pain, or shortness of breath, no history of blood clots, walks several miles a day, no unusual fatigue or weight changes, no recent falls/trauma, taking gabapentin for that - had shingles in the same area 3 years ago; no rash now. ?Leg cramping at night - for the past few months, drinks a lot of water, reports he has had low electrolytes in the past and ended up having a seizure, takes "C salts" electrolytes every day - seems to help usually, but cramping has been getting worse lately  ? ? ? ? ?______________________________________________________________________ ?PMH ?Past Medical History:  ?Diagnosis Date  ? Depression   ? Shingles   ? ? ?ROS ?All review of systems negative except what is listed in the HPI ? ?PHYSICAL EXAM ?Physical Exam ?Vitals reviewed.  ?Constitutional:   ?   Appearance: Normal appearance. He is normal weight.  ?Cardiovascular:  ?   Rate and Rhythm: Normal rate and  regular rhythm.  ?Pulmonary:  ?   Effort: Pulmonary effort is normal.  ?   Breath sounds: Normal breath sounds. No wheezing, rhonchi or rales.  ?Musculoskeletal:  ?   Cervical back: Normal range of motion and neck supple.  ?   Comments: Tenderness to light palpation over right lateral ribs, midaxillary line  ?Skin: ?   Findings: No bruising, erythema, lesion or rash.  ?Neurological:  ?   General: No focal deficit present.  ?   Mental Status: He is alert and oriented to person, place, and time. Mental status is at baseline.  ?Psychiatric:     ?   Mood and Affect: Mood normal.     ?   Behavior: Behavior normal.     ?   Thought Content: Thought content normal.     ?   Judgment: Judgment normal.  ? ?______________________________________________________________________ ?ASSESSMENT AND PLAN ? ?1. Leg cramp ?Updating labs today to check for potential causes.  ?- CBC ?- Comprehensive metabolic panel ?- Magnesium ? ?2. Postherpetic neuralgia ?Refilling gabapentin. Recommend he try lidocaine patches to the area as well. Patient aware of signs/symptoms requiring further/urgent evaluation.  ?- gabapentin (NEURONTIN) 300 MG capsule; Take 1 capsule (300 mg total) by mouth 4 (four) times daily.  Dispense: 360 capsule; Refill: 0 ? ?3. Encounter for screening for HIV ?- HIV Antibody (routine testing w rflx) ? ?4. Need for hepatitis C screening test ?- Hepatitis C antibody ? ?5. Screen for colon cancer ?-  Ambulatory referral to Gastroenterology ? ?6. Drug dependence (HCC) ?Continue following with methadone clinic. Aware that no narcotics will be prescribed here.  ? ?Establish care  ?Education provided today during visit and on AVS for patient to review at home.  ?Diet and Exercise recommendations provided.  ?Current diagnoses and recommendations discussed. ?HM recommendations reviewed with recommendations.  ? ? ?Outpatient Encounter Medications as of 11/04/2021  ?Medication Sig  ? HYDROcodone-acetaminophen (NORCO) 7.5-325 MG tablet  Take 1 tablet by mouth 2 (two) times daily.  ? METHADONE HCL PO Take 130 mg by mouth daily.  ? gabapentin (NEURONTIN) 300 MG capsule Take 1 capsule (300 mg total) by mouth 4 (four) times daily.  ? [DISCONTINUED] B Complex-C (B-COMPLEX WITH VITAMIN C) tablet Take 1 tablet by mouth daily.   ? [DISCONTINUED] gabapentin (NEURONTIN) 300 MG capsule Take 1 capsule (300 mg total) by mouth 3 (three) times daily. (Patient taking differently: Take 300 mg by mouth 4 (four) times daily.)  ? [DISCONTINUED] gabapentin (NEURONTIN) 300 MG capsule Take 1 capsule (300 mg total) by mouth 4 (four) times daily.  ? ?No facility-administered encounter medications on file as of 11/04/2021.  ? ? ?Return for physical at your convenience . ? ? ?I spent 30 minutes dedicated to the care of this patient on the date of this encounter to include pre-visit chart review of prior notes and results, face-to-face time with the patient, and post-visit ordering of testing as indicated.  ? ? ?Lollie Marrow Reola Calkins, DNP, FNP-C ? ? ?

## 2021-11-05 LAB — CBC
HCT: 35.1 % — ABNORMAL LOW (ref 39.0–52.0)
Hemoglobin: 11.7 g/dL — ABNORMAL LOW (ref 13.0–17.0)
MCHC: 33.3 g/dL (ref 30.0–36.0)
MCV: 93.1 fl (ref 78.0–100.0)
Platelets: 704 10*3/uL — ABNORMAL HIGH (ref 150.0–400.0)
RBC: 3.76 Mil/uL — ABNORMAL LOW (ref 4.22–5.81)
RDW: 18.2 % — ABNORMAL HIGH (ref 11.5–15.5)
WBC: 5.8 10*3/uL (ref 4.0–10.5)

## 2021-11-05 LAB — COMPREHENSIVE METABOLIC PANEL
ALT: 12 U/L (ref 0–53)
AST: 19 U/L (ref 0–37)
Albumin: 4.3 g/dL (ref 3.5–5.2)
Alkaline Phosphatase: 78 U/L (ref 39–117)
BUN: 10 mg/dL (ref 6–23)
CO2: 31 mEq/L (ref 19–32)
Calcium: 9.3 mg/dL (ref 8.4–10.5)
Chloride: 98 mEq/L (ref 96–112)
Creatinine, Ser: 0.72 mg/dL (ref 0.40–1.50)
GFR: 102.89 mL/min (ref >60.00)
Glucose, Bld: 104 mg/dL — ABNORMAL HIGH (ref 70–99)
Potassium: 4.9 mEq/L (ref 3.5–5.1)
Sodium: 137 mEq/L (ref 135–145)
Total Bilirubin: 0.4 mg/dL (ref 0.2–1.2)
Total Protein: 7 g/dL (ref 6.0–8.3)

## 2021-11-05 LAB — HEPATITIS C ANTIBODY
Hepatitis C Ab: NONREACTIVE
SIGNAL TO CUT-OFF: 0.12 (ref <1.00)

## 2021-11-05 LAB — MAGNESIUM: Magnesium: 2.1 mg/dL (ref 1.5–2.5)

## 2021-11-05 LAB — HIV ANTIBODY (ROUTINE TESTING W REFLEX): HIV 1&2 Ab, 4th Generation: NONREACTIVE

## 2021-11-05 NOTE — Addendum Note (Signed)
Addended by: Hyman Hopes B on: 11/05/2021 09:50 PM ? ? Modules accepted: Orders ? ?

## 2021-11-05 NOTE — Progress Notes (Signed)
Platelets are high and hemoglobin is slightly low. Please schedule a 2-week lab appointment to recheck these levels. If still abnormal, I may add on additional labs and consider hematology referral for the platelet count.  ? ?Other labs stable. No changes.

## 2021-11-17 ENCOUNTER — Other Ambulatory Visit (INDEPENDENT_AMBULATORY_CARE_PROVIDER_SITE_OTHER): Payer: 59

## 2021-11-17 DIAGNOSIS — R7989 Other specified abnormal findings of blood chemistry: Secondary | ICD-10-CM | POA: Diagnosis not present

## 2021-11-17 LAB — CBC WITH DIFFERENTIAL/PLATELET
Basophils Absolute: 0.1 10*3/uL (ref 0.0–0.1)
Basophils Relative: 1.4 % (ref 0.0–3.0)
Eosinophils Absolute: 0.3 10*3/uL (ref 0.0–0.7)
Eosinophils Relative: 3.8 % (ref 0.0–5.0)
HCT: 36.6 % — ABNORMAL LOW (ref 39.0–52.0)
Hemoglobin: 12.2 g/dL — ABNORMAL LOW (ref 13.0–17.0)
Lymphocytes Relative: 21.6 % (ref 12.0–46.0)
Lymphs Abs: 1.7 10*3/uL (ref 0.7–4.0)
MCHC: 33.4 g/dL (ref 30.0–36.0)
MCV: 92.4 fl (ref 78.0–100.0)
Monocytes Absolute: 0.5 10*3/uL (ref 0.1–1.0)
Monocytes Relative: 6.2 % (ref 3.0–12.0)
Neutro Abs: 5.4 10*3/uL (ref 1.4–7.7)
Neutrophils Relative %: 67 % (ref 43.0–77.0)
Platelets: 637 10*3/uL — ABNORMAL HIGH (ref 150.0–400.0)
RBC: 3.96 Mil/uL — ABNORMAL LOW (ref 4.22–5.81)
RDW: 18.1 % — ABNORMAL HIGH (ref 11.5–15.5)
WBC: 8.1 10*3/uL (ref 4.0–10.5)

## 2021-11-18 NOTE — Addendum Note (Signed)
Addended by: Caleen Jobs B on: 11/18/2021 08:10 AM ? ? Modules accepted: Orders ? ?

## 2021-11-25 ENCOUNTER — Other Ambulatory Visit: Payer: Self-pay | Admitting: Family

## 2021-11-25 DIAGNOSIS — D75839 Thrombocytosis, unspecified: Secondary | ICD-10-CM

## 2021-11-25 DIAGNOSIS — D509 Iron deficiency anemia, unspecified: Secondary | ICD-10-CM

## 2021-11-26 ENCOUNTER — Inpatient Hospital Stay: Payer: 59 | Attending: Pediatrics

## 2021-11-26 DIAGNOSIS — D509 Iron deficiency anemia, unspecified: Secondary | ICD-10-CM

## 2021-11-26 DIAGNOSIS — R5383 Other fatigue: Secondary | ICD-10-CM | POA: Insufficient documentation

## 2021-11-26 DIAGNOSIS — Z809 Family history of malignant neoplasm, unspecified: Secondary | ICD-10-CM | POA: Insufficient documentation

## 2021-11-26 DIAGNOSIS — D649 Anemia, unspecified: Secondary | ICD-10-CM | POA: Insufficient documentation

## 2021-11-26 DIAGNOSIS — D75839 Thrombocytosis, unspecified: Secondary | ICD-10-CM | POA: Diagnosis present

## 2021-11-26 DIAGNOSIS — Z87891 Personal history of nicotine dependence: Secondary | ICD-10-CM | POA: Insufficient documentation

## 2021-11-26 LAB — CMP (CANCER CENTER ONLY)
ALT: 20 U/L (ref 0–44)
AST: 23 U/L (ref 15–41)
Albumin: 4.6 g/dL (ref 3.5–5.0)
Alkaline Phosphatase: 68 U/L (ref 38–126)
Anion gap: 7 (ref 5–15)
BUN: 8 mg/dL (ref 6–20)
CO2: 30 mmol/L (ref 22–32)
Calcium: 9.7 mg/dL (ref 8.9–10.3)
Chloride: 101 mmol/L (ref 98–111)
Creatinine: 0.84 mg/dL (ref 0.61–1.24)
GFR, Estimated: >60 mL/min (ref >60)
Glucose, Bld: 115 mg/dL — ABNORMAL HIGH (ref 70–99)
Potassium: 4.4 mmol/L (ref 3.5–5.1)
Sodium: 138 mmol/L (ref 135–145)
Total Bilirubin: 0.4 mg/dL (ref 0.3–1.2)
Total Protein: 7.3 g/dL (ref 6.5–8.1)

## 2021-11-26 LAB — CBC WITH DIFFERENTIAL (CANCER CENTER ONLY)
Abs Immature Granulocytes: 0.16 10*3/uL — ABNORMAL HIGH (ref 0.00–0.07)
Basophils Absolute: 0.1 10*3/uL (ref 0.0–0.1)
Basophils Relative: 2 %
Eosinophils Absolute: 0.3 10*3/uL (ref 0.0–0.5)
Eosinophils Relative: 4 %
HCT: 38.3 % — ABNORMAL LOW (ref 39.0–52.0)
Hemoglobin: 12.3 g/dL — ABNORMAL LOW (ref 13.0–17.0)
Immature Granulocytes: 2 %
Lymphocytes Relative: 18 %
Lymphs Abs: 1.7 10*3/uL (ref 0.7–4.0)
MCH: 30.1 pg (ref 26.0–34.0)
MCHC: 32.1 g/dL (ref 30.0–36.0)
MCV: 93.6 fL (ref 80.0–100.0)
Monocytes Absolute: 0.5 10*3/uL (ref 0.1–1.0)
Monocytes Relative: 6 %
Neutro Abs: 6.5 10*3/uL (ref 1.7–7.7)
Neutrophils Relative %: 68 %
Platelet Count: 578 10*3/uL — ABNORMAL HIGH (ref 150–400)
RBC: 4.09 MIL/uL — ABNORMAL LOW (ref 4.22–5.81)
RDW: 15.9 % — ABNORMAL HIGH (ref 11.5–15.5)
WBC Count: 9.4 10*3/uL (ref 4.0–10.5)
nRBC: 0 % (ref 0.0–0.2)

## 2021-11-26 LAB — IRON AND IRON BINDING CAPACITY (CC-WL,HP ONLY)
Iron: 83 ug/dL (ref 45–182)
Saturation Ratios: 21 % (ref 17.9–39.5)
TIBC: 398 ug/dL (ref 250–450)
UIBC: 315 ug/dL (ref 117–376)

## 2021-11-26 LAB — RETICULOCYTES
Immature Retic Fract: 25.3 % — ABNORMAL HIGH (ref 2.3–15.9)
RBC.: 4.05 MIL/uL — ABNORMAL LOW (ref 4.22–5.81)
Retic Count, Absolute: 104.5 10*3/uL (ref 19.0–186.0)
Retic Ct Pct: 2.6 % (ref 0.4–3.1)

## 2021-11-26 LAB — LACTATE DEHYDROGENASE: LDH: 383 U/L — ABNORMAL HIGH (ref 98–192)

## 2021-11-26 LAB — FERRITIN: Ferritin: 151 ng/mL (ref 24–336)

## 2021-11-26 LAB — SAVE SMEAR(SSMR), FOR PROVIDER SLIDE REVIEW

## 2021-11-27 ENCOUNTER — Encounter: Payer: Self-pay | Admitting: Family

## 2021-11-27 ENCOUNTER — Inpatient Hospital Stay: Payer: 59

## 2021-11-27 ENCOUNTER — Inpatient Hospital Stay (HOSPITAL_BASED_OUTPATIENT_CLINIC_OR_DEPARTMENT_OTHER): Payer: 59 | Admitting: Family

## 2021-11-27 ENCOUNTER — Other Ambulatory Visit: Payer: Self-pay

## 2021-11-27 ENCOUNTER — Other Ambulatory Visit: Payer: 59

## 2021-11-27 VITALS — BP 111/57 | HR 73 | Temp 98.4°F | Resp 18 | Ht 69.0 in | Wt 165.0 lb

## 2021-11-27 DIAGNOSIS — R1012 Left upper quadrant pain: Secondary | ICD-10-CM | POA: Diagnosis not present

## 2021-11-27 DIAGNOSIS — D75839 Thrombocytosis, unspecified: Secondary | ICD-10-CM

## 2021-11-27 NOTE — Progress Notes (Signed)
Hematology/Oncology Consultation  ? ?Name: Kristopher Yoder      MRN: [id number]    Location: Room/bed info not found  Date: 11/27/2021 Time:9:10 AM ? ? ?REFERRING PHYSICIAN: Hyman Hopes, Np ? ?REASON FOR CONSULT: High platelet count  ?  ?DIAGNOSIS: Thrombocytosis  ? ?HISTORY OF PRESENT ILLNESS: Kristopher Yoder is a very pleasant 56 yo caucasian gentleman with thrombocytosis for at least the last 13 years. He also has mild anemia noted the last few months. Iron studies are stable.  ?LDH 383. JAK 2 assay pending.  ?He notes persistent fatigue that has slowly progressed over the last 6 months.   ?He also has intermittent cramping in his calves.  ?No known family history of thrombocytopenia or anemia.  ?No personal history of cancer. His brother has lymphoma with history of bone marrow transplant.  ?Recent Hep C and HIV testing negative. ?No history of diabetes or thyroid disease.  ?No surgical history per patient.  ?He has had no issue with frequent infections. No fever, chills, n/v, cough, rash, dizziness, SOB, chest pain, palpitations, abdominal pain or changes in bowel or bladder habits.  ?No swelling, numbness or tingling in his extremities.  ?No falls or syncope.  ?Denies smoking, ETOH or recreational drug use.  ?His appetite comes and goes. He does feel that he is staying well hydrated. His weight is stable at 165 lbs.  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  ?He enjoys walking for exercise.  ? ?ROS: All other 10 point review of systems is negative.  ? ?PAST MEDICAL HISTORY:   ?Past Medical History:  ?Diagnosis Date  ? Depression   ? Shingles   ? ? ?ALLERGIES: ?No Known Allergies ?   ?MEDICATIONS:  ?Current Outpatient Medications on File Prior to Visit  ?Medication Sig Dispense Refill  ? gabapentin (NEURONTIN) 300 MG capsule Take 1 capsule (300 mg total) by mouth 4 (four) times daily. 360 capsule 0  ? METHADONE HCL PO Take 130 mg by mouth daily.    ? ?No current  facility-administered medications on file prior to visit.  ? ?  ?PAST SURGICAL HISTORY ?No past surgical history on file. ? ?FAMILY HISTORY: ?Family History  ?Problem Relation Age of Onset  ? Alcohol abuse Father   ? Cancer Brother   ? Heart attack Maternal Grandmother   ? ? ?SOCIAL HISTORY: ? reports that he quit smoking about 2 years ago. His smoking use included cigarettes. He smoked an average of .5 packs per day. He has never used smokeless tobacco. He reports current drug use. Drug: Hydrocodone. He reports that he does not drink alcohol. ? ?PERFORMANCE STATUS: ?The patient's performance status is 1 - Symptomatic but completely ambulatory ? ?PHYSICAL EXAM: ?Most Recent Vital Signs: Blood pressure (!) 111/57, pulse 73, temperature 98.4 ?F (36.9 ?C), temperature source Oral, resp. rate 18, height 5\' 9"  (1.753 m), weight 165 lb (74.8 kg), SpO2 98 %. ?BP (!) 111/57 (BP Location: Left Arm, Patient Position: Sitting)   Pulse 73   Temp 98.4 ?F (36.9 ?C) (Oral)   Resp 18   Ht 5\' 9"  (1.753 m)   Wt 165 lb (74.8 kg)   SpO2 98%   BMI 24.37 kg/m?  ? ?General Appearance:    Alert, cooperative, no distress, appears stated age  ?Head:    Normocephalic, without obvious abnormality, atraumatic  ?Eyes:    PERRL, conjunctiva/corneas clear, EOM's intact, fundi  ?  benign, both eyes       ?   ?   ?  Throat:   Lips, mucosa, and tongue normal; teeth and gums normal  ?Neck:   Supple, symmetrical, trachea midline, no adenopathy;     ?  thyroid:  No enlargement/tenderness/nodules; no carotid ?  bruit or JVD  ?Back:     Symmetric, no curvature, ROM normal, no CVA tenderness  ?Lungs:     Clear to auscultation bilaterally, respirations unlabored  ?Chest wall:    No tenderness or deformity  ?Heart:    Regular rate and rhythm, S1 and S2 normal, no murmur, rub   or gallop  ?Abdomen:     Soft, non-tender, bowel sounds active all four quadrants,  ?  no masses, no organomegaly  ?   ?   ?Extremities:   Extremities normal, atraumatic, no  cyanosis or edema  ?Pulses:   2+ and symmetric all extremities  ?Skin:   Skin color, texture, turgor normal, no rashes or lesions  ?Lymph nodes:   Cervical, supraclavicular, and axillary nodes normal  ?Neurologic:   CNII-XII intact. Normal strength, sensation and reflexes    ?  throughout  ? ? ?LABORATORY DATA:  ?Results for orders placed or performed in visit on 11/26/21 (from the past 48 hour(s))  ?CBC with Differential (Cancer Center Only)     Status: Abnormal  ? Collection Time: 11/26/21  8:46 AM  ?Result Value Ref Range  ? WBC Count 9.4 4.0 - 10.5 K/uL  ? RBC 4.09 (L) 4.22 - 5.81 MIL/uL  ? Hemoglobin 12.3 (L) 13.0 - 17.0 g/dL  ? HCT 38.3 (L) 39.0 - 52.0 %  ? MCV 93.6 80.0 - 100.0 fL  ? MCH 30.1 26.0 - 34.0 pg  ? MCHC 32.1 30.0 - 36.0 g/dL  ? RDW 15.9 (H) 11.5 - 15.5 %  ? Platelet Count 578 (H) 150 - 400 K/uL  ? nRBC 0.0 0.0 - 0.2 %  ? Neutrophils Relative % 68 %  ? Neutro Abs 6.5 1.7 - 7.7 K/uL  ? Lymphocytes Relative 18 %  ? Lymphs Abs 1.7 0.7 - 4.0 K/uL  ? Monocytes Relative 6 %  ? Monocytes Absolute 0.5 0.1 - 1.0 K/uL  ? Eosinophils Relative 4 %  ? Eosinophils Absolute 0.3 0.0 - 0.5 K/uL  ? Basophils Relative 2 %  ? Basophils Absolute 0.1 0.0 - 0.1 K/uL  ? Immature Granulocytes 2 %  ? Abs Immature Granulocytes 0.16 (H) 0.00 - 0.07 K/uL  ?  Comment: Performed at [facility] [place], [address], [place], [place] [id number]  ?CMP (Cancer Center only)     Status: Abnormal  ? Collection Time: 11/26/21  8:46 AM  ?Result Value Ref Range  ? Sodium 138 135 - 145 mmol/L  ? Potassium 4.4 3.5 - 5.1 mmol/L  ? Chloride 101 98 - 111 mmol/L  ? CO2 30 22 - 32 mmol/L  ? Glucose, Bld 115 (H) 70 - 99 mg/dL  ?  Comment: Glucose reference range applies only to samples taken after fasting for at least 8 hours.  ? BUN 8 6 - 20 mg/dL  ? Creatinine 0.84 0.61 - 1.24 mg/dL  ? Calcium 9.7 8.9 - 10.3 mg/dL  ? Total Protein 7.3 6.5 - 8.1 g/dL  ? Albumin 4.6 3.5 - 5.0 g/dL  ? AST 23 15 - 41 U/L  ? ALT  20 0 - 44 U/L  ? Alkaline Phosphatase 68 38 - 126 U/L  ? Total Bilirubin 0.4 0.3 - 1.2 mg/dL  ? GFR, Estimated >60 >  60 mL/min  ?  Comment: (NOTE) ?Calculated using the CKD-EPI Creatinine Equation (2021) ?  ? Anion gap 7 5 - 15  ?  Comment: Performed at [facility], [address] [place], [place] [address]  ?Lactate dehydrogenase (LDH)     Status: Abnormal  ? Collection Time: 11/26/21  8:46 AM  ?Result Value Ref Range  ? LDH 383 (H) 98 - 192 U/L  ?  Comment: Performed at [facility], [address], [place], [place] [address]  ?Save Smear [facility])     Status: None  ? Collection Time: 11/26/21  8:46 AM  ?Result Value Ref Range  ? Smear Review SMEAR STAINED AND AVAILABLE FOR REVIEW   ?  Comment: Performed at [facility] at [facility], [address], [place], [place] [address]  ?Iron and Iron Binding Capacity ([facility] only)     Status: None  ? Collection Time: 11/26/21  8:46 AM  ?Result Value Ref Range  ? Iron 83 45 - 182 ug/dL  ? TIBC 398 250 - 450 ug/dL  ? Saturation Ratios 21 17.9 - 39.5 %  ? UIBC 315 117 - 376 ug/dL  ?  Comment: Performed at [facility], [address], [place], [place] [address]  ?Ferritin     Status: None  ? Collection Time: 11/26/21  8:46 AM  ?Result Value Ref Range  ? Ferritin 151 24 - 336 ng/mL  ?  Comment: Performed at [facility], [address] [place], [place] [address]  ?Reticulocytes     Status: Abnormal  ? Collection Time: 11/26/21  8:46 AM  ?Result Value Ref Range  ? Retic Ct Pct 2.6 0.4 - 3.1 %  ? RBC. 4.05 (L) 4.22 - 5.81 MIL/uL  ? Retic Count, Absolute 104.5 19.0 - 186.0 K/uL  ? Immature Retic Fract 25.3 (H) 2.3 - 15.9 %  ?  Comment: Performed at [facility] at [facility], [address] [place], [place] [address]  ?   ? ?RADIOGRAPHY: ?No results found.   ?  ?PATHOLOGY: None ? ?ASSESSMENT/PLAN: Mr. Knoblock is a very  pleasant 56 yo caucasian gentleman with thrombocytosis for at least the last 13 years as well as recent mild anemia. ?Blood smear reviewed with Dr. Myna Hidalgo. Platelets are quite large in size. Smear otherwise unremark

## 2021-12-02 ENCOUNTER — Ambulatory Visit (HOSPITAL_BASED_OUTPATIENT_CLINIC_OR_DEPARTMENT_OTHER): Admission: RE | Admit: 2021-12-02 | Payer: 59 | Source: Ambulatory Visit

## 2021-12-07 LAB — JAK 2 V617F (GENPATH)

## 2021-12-08 ENCOUNTER — Other Ambulatory Visit: Payer: Self-pay | Admitting: Family

## 2021-12-08 ENCOUNTER — Telehealth: Payer: Self-pay | Admitting: Family

## 2021-12-08 DIAGNOSIS — D6804 Acquired von Willebrand disease: Secondary | ICD-10-CM

## 2021-12-08 NOTE — Telephone Encounter (Signed)
Lab work reviewed with Dr. Myna Hidalgo.  ?I was able to speak with the patient and go over recent lab work. He had a positive JAK 2 with essential thrombocythemia.  ?We will check a VWB panel at his next visit to rule out acquired VWB disease. He denies bleeding, bruising or petechiae.  ?He will start taking 1 coated baby aspirin daily.  ?Patient states that his [place] will cost over $400 up front. He is still considering this. We do not want to cause him any financial stress so he can hold off if needed for now.  ?No other questions or concerns. Patient verbalized understanding and agreement with the plan. He was appreciative of call.  ?

## 2022-01-08 ENCOUNTER — Inpatient Hospital Stay: Payer: 59 | Attending: Pediatrics

## 2022-01-08 ENCOUNTER — Inpatient Hospital Stay: Payer: 59 | Admitting: Family

## 2022-02-16 ENCOUNTER — Encounter: Payer: Self-pay | Admitting: Family Medicine

## 2022-02-16 ENCOUNTER — Ambulatory Visit (INDEPENDENT_AMBULATORY_CARE_PROVIDER_SITE_OTHER): Payer: 59 | Admitting: Family Medicine

## 2022-02-16 ENCOUNTER — Other Ambulatory Visit: Payer: Self-pay | Admitting: Family Medicine

## 2022-02-16 VITALS — BP 115/54 | HR 85 | Ht 69.0 in | Wt 142.0 lb

## 2022-02-16 DIAGNOSIS — Z5321 Procedure and treatment not carried out due to patient leaving prior to being seen by health care provider: Secondary | ICD-10-CM

## 2022-02-16 DIAGNOSIS — B0229 Other postherpetic nervous system involvement: Secondary | ICD-10-CM

## 2022-02-17 ENCOUNTER — Telehealth: Payer: 59 | Admitting: Physician Assistant

## 2022-02-17 DIAGNOSIS — S3992XS Unspecified injury of lower back, sequela: Secondary | ICD-10-CM

## 2022-02-17 DIAGNOSIS — R590 Localized enlarged lymph nodes: Secondary | ICD-10-CM

## 2022-02-17 DIAGNOSIS — N4889 Other specified disorders of penis: Secondary | ICD-10-CM

## 2022-02-17 NOTE — Patient Instructions (Signed)
  Loel Ro, thank you for joining [name], PA-C for today's virtual visit.  While this provider is not your primary care provider (PCP), if your PCP is located in our provider database this encounter information will be shared with them immediately following your visit.  Consent: (Patient) Les Pou Lapiana provided verbal consent for this virtual visit at the beginning of the encounter.  Current Medications:  Current Outpatient Medications:    gabapentin (NEURONTIN) 300 MG capsule, Take 1 capsule (300 mg total) by mouth 4 (four) times daily., Disp: 360 capsule, Rfl: 0   METHADONE HCL PO, Take 130 mg by mouth daily., Disp: , Rfl:    Medications ordered in this encounter:  No orders of the defined types were placed in this encounter.    *If you need refills on other medications prior to your next appointment, please contact your pharmacy*  Follow-Up: Call back or seek an in-person evaluation if the symptoms worsen or if the condition fails to improve as anticipated.  Other Instructions Use the link below to get scheduled with our UC for evaluation. If anything worsens before evaluation, ER.    If you have been instructed to have an in-person evaluation today at a local Urgent Care facility, please use the link below. It will take you to a list of all of our available [facility], including address, phone number and hours of operation. Please do not delay care.  [facility]  If you or a family member do not have a primary care provider, use the link below to schedule a visit and establish care. When you choose a [facility] primary care physician or advanced practice provider, you gain a long-term partner in health. Find a Primary Care Provider  Learn more about [facility]'s in-office and virtual care options:  - Get Care Now

## 2022-02-17 NOTE — Progress Notes (Signed)
Because of pain starting after an injury and pain going on for > 4 weeks, I feel your condition warrants further evaluation and I recommend that you be seen in a face to face visit.   NOTE: There will be NO CHARGE for this eVisit   If you are having a true medical emergency please call 911.      For an urgent face to face visit, [place] has seven urgent care centers for your convenience:     [facility] Center at [facility] Directions [phone] [address] [place], [place] [address]    [facility]) Get Driving Directions [phone] [address] [place], [place] [address]  [facility] Center [facility]) Get Driving Directions [phone] [address] [place],  [place]  [address]  [facility] Ur[facility] Center [facility] at [employer] Directions  [phone] 267 356 [address] [place],  [place] [address]   [facility] Urgent Care at [facility] Get Driving Directions [phone] [address] [place], [place] [address]   [facility] Urgent Care at [facility] Get Driving Directions  [phone] [address] [place], [place] [address]   [facility] Urgent Care at [facility] Directions [phone] [address], [address] [place], [place] [address]  Your MyChart E-visit questionnaire answers were reviewed by a board certified advanced clinical practitioner to complete your personal care plan based on your specific symptoms.  Thank you for using e-Visits.

## 2022-02-17 NOTE — Progress Notes (Signed)
Appointment cancelled. Left without being seen.

## 2022-02-17 NOTE — Telephone Encounter (Signed)
Looks like Pt left yesterday w/o being seen?

## 2022-02-17 NOTE — Progress Notes (Signed)
Virtual Visit Consent   Kristopher Yoder, you are scheduled for a virtual visit with a [facility] provider today. Just as with appointments in the office, your consent must be obtained to participate. Your consent will be active for this visit and any virtual visit you may have with one of our providers in the next 365 days. If you have a MyChart account, a copy of this consent can be sent to you electronically.  As this is a virtual visit, video technology does not allow for your provider to perform a traditional examination. This may limit your provider's ability to fully assess your condition. If your provider identifies any concerns that need to be evaluated in person or the need to arrange testing (such as labs, EKG, etc.), we will make arrangements to do so. Although advances in technology are sophisticated, we cannot ensure that it will always work on either your end or our end. If the connection with a video visit is poor, the visit may have to be switched to a telephone visit. With either a video or telephone visit, we are not always able to ensure that we have a secure connection.  By engaging in this virtual visit, you consent to the provision of healthcare and authorize for your insurance to be billed (if applicable) for the services provided during this visit. Depending on your insurance coverage, you may receive a charge related to this service.  I need to obtain your verbal consent now. Are you willing to proceed with your visit today? [name] has provided verbal consent on 02/17/2022 for a virtual visit (video or telephone). Kristopher Yoder, [place]  Date: 02/17/2022 8:08 AM  Virtual Visit via Video Note   I, Kristopher Yoder, connected with  Kristopher Yoder  ([id number], 09-Jun-1966) on 02/17/22 at  8:00 AM EDT by a video-enabled telemedicine application and verified that I am speaking with the correct person using two identifiers.  Location: Patient: Virtual Visit Location  Patient: Home Provider: Virtual Visit Location Provider: Home Office   I discussed the limitations of evaluation and management by telemedicine and the availability of in person appointments. The patient expressed understanding and agreed to proceed.    History of Present Illness: Kristopher Yoder is a 56 y.o. who identifies as a male who was assigned male at birth, and is being seen today for "feeling bad all over". Is now noting swelling of his lymph nodes around his neck and now in the inguinal region with significant swelling and pain. Feels like this is progressing since onset. Denies fever. Notes odynophagia with swallowing due to lymph node swelling. Notes swollen glands are all R-sided. 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 Also noted via e-visit earlier today that he had a seizure about 4 weeks ago and fell into a wall. Also now noting penile pain, redness and swelling. Notes new sexual partners recently. Denies dysuria, urgency, frequency or hematuria.   HPI: HPI  Problems:  Patient Active Problem List   Diagnosis Date Noted   Postherpetic neuralgia 11/04/2021   Drug dependence (HCC) 11/04/2021    Allergies: No Known Allergies Medications:  Current Outpatient Medications:    gabapentin (NEURONTIN) 300 MG capsule, Take 1 capsule (300 mg total) by mouth 4 (four) times daily., Disp: 360 capsule, Rfl: 0   METHADONE HCL PO, Take 130 mg by mouth daily., Disp: , Rfl:  Observations/Objective: Patient is well-developed, well-nourished in no acute distress.  Resting comfortably at home.  Head is normocephalic, atraumatic.  No labored breathing. Speech is clear and coherent with logical content.  Patient is alert and oriented at baseline.   Assessment and Plan: 1. Inguinal lymphadenopathy 2. Penile swelling 3. Cervical lymphadenopathy  Needs in-person evaluation ASAP due to concern for STI and/or  other serious infection. Link given for UC appt if his PCP cannot evaluate him. ER for any acutely worsening symptoms.   Follow Up Instructions: I discussed the assessment and treatment plan with the patient. The patient was provided an opportunity to ask questions and all were answered. The patient agreed with the plan and demonstrated an understanding of the instructions.  A copy of instructions were sent to the patient via MyChart unless otherwise noted below.   The patient was advised to call back or seek an in-person evaluation if the symptoms worsen or if the condition fails to improve as anticipated.  Time:  I spent 10 minutes with the patient via telehealth technology discussing the above problems/concerns.    Kristopher Climes, PA-C

## 2022-03-05 ENCOUNTER — Other Ambulatory Visit: Payer: Self-pay | Admitting: Family Medicine

## 2022-03-05 ENCOUNTER — Telehealth: Payer: 59 | Admitting: Physician Assistant

## 2022-03-05 DIAGNOSIS — B0229 Other postherpetic nervous system involvement: Secondary | ICD-10-CM

## 2022-03-05 DIAGNOSIS — Z760 Encounter for issue of repeat prescription: Secondary | ICD-10-CM

## 2022-03-05 NOTE — Progress Notes (Signed)
   Thank you for the details you included in the comment boxes. Those details are very helpful in determining the best course of treatment for you and help [place] to provide the best care.Because of requesting a medication refill, we recommend that you convert this visit to a video visit in order for the provider to better assess what is going on.  The provider will be able to give you a more accurate diagnosis and treatment plan if we can more freely discuss your symptoms and with the addition of a virtual examination.   If you convert to a video visit, we will bill your insurance (similar to an office visit) and you will not be charged for this e-Visit. You will be able to stay at home and speak with the first available [facility] advanced practice provider. The link to do a video visit is in the drop down Menu tab of your Welcome screen in MyChart.  I provided 5 minutes of non face-to-face time during this encounter for chart review and documentation.

## 2022-03-08 ENCOUNTER — Telehealth: Payer: 59

## 2022-03-08 MED ORDER — GABAPENTIN 300 MG PO CAPS: 300.0000 mg | ORAL_CAPSULE | Freq: Four times a day (QID) | ORAL | 0 refills | Status: DC

## 2022-03-09 ENCOUNTER — Telehealth: Payer: 59 | Admitting: Family Medicine

## 2022-03-09 NOTE — Progress Notes (Signed)
The patient no-showed for appointment despite this provider sending direct link, reaching out via phone with no response and waiting for at least 10 minutes from appointment time for patient to join. They will be marked as a NS for this appointment/time.   Leighana Neyman M Favor Hackler, NP    

## 2022-04-22 ENCOUNTER — Telehealth: Payer: 59 | Admitting: Physician Assistant

## 2022-04-22 DIAGNOSIS — H00012 Hordeolum externum right lower eyelid: Secondary | ICD-10-CM | POA: Diagnosis not present

## 2022-04-22 MED ORDER — NEOMYCIN-POLYMYXIN-HC 3.5-10000-1 OP SUSP: 3.0000 [drp] | Freq: Four times a day (QID) | OPHTHALMIC | 0 refills | Status: AC

## 2022-04-22 NOTE — Progress Notes (Signed)
I have spent 5 minutes in review of e-visit questionnaire, review and updating patient chart, medical decision making and response to patient.   Kristopher Broecker Cody Collyn Selk, PA-C    

## 2022-04-22 NOTE — Progress Notes (Signed)
  E-Visit for Stye   We are sorry that you are not feeling well. Here is how we plan to help!  Based on what you have shared with me it looks like you have a stye.  A stye is an inflammation of the eyelid.  It is often a red, painful lump near the edge of the eyelid that may look like a boil or a pimple.  A stye develops when an infection occurs at the base of an eyelash.   We have made appropriate suggestions for you based upon your presentation: Simple styes can be treated without medical intervention.  Most styes either resolve spontaneously or resolve with simple home treatment by applying warm compresses or heated washcloth to the stye for about 10-15 minutes three to four times a day. This causes the stye to drain and resolve. I will add on a topical antibiotic due to concern for start of infection. Take as directed.   HOME CARE:  Wash your hands often! Let the stye open on its own. Don't squeeze or open it. Don't rub your eyes. This can irritate your eyes and let in bacteria.  If you need to touch your eyes, wash your hands first. Don't wear eye makeup or contact lenses until the area has healed.  GET HELP RIGHT AWAY IF:  Your symptoms do not improve. You develop blurred or loss of vision. Your symptoms worsen (increased discharge, pain or redness).   Thank you for choosing an e-visit.  Your e-visit answers were reviewed by a board certified advanced clinical practitioner to complete your personal care plan. Depending upon the condition, your plan could have included both over the counter or prescription medications.  Please review your pharmacy choice. Make sure the pharmacy is open so you can pick up prescription now. If there is a problem, you may contact your provider through [employer] and have the prescription routed to another pharmacy.  Your safety is important to [place]. If you have drug allergies check your prescription carefully.   For the next 24 hours you can use  MyChart to ask questions about today's visit, request a non-urgent call back, or ask for a work or school excuse. You will get an email in the next two days asking about your experience. I hope that your e-visit has been valuable and will speed your recovery.

## 2023-03-15 ENCOUNTER — Other Ambulatory Visit: Payer: Self-pay | Admitting: Family Medicine

## 2023-03-15 DIAGNOSIS — B0229 Other postherpetic nervous system involvement: Secondary | ICD-10-CM

## 2023-03-28 ENCOUNTER — Ambulatory Visit: Payer: BLUE CROSS/BLUE SHIELD | Admitting: Family Medicine

## 2023-03-28 ENCOUNTER — Encounter: Payer: Self-pay | Admitting: Family Medicine

## 2023-03-28 VITALS — BP 109/58 | HR 69 | Ht 69.0 in | Wt 174.0 lb

## 2023-03-28 DIAGNOSIS — G6289 Other specified polyneuropathies: Secondary | ICD-10-CM

## 2023-03-28 DIAGNOSIS — G4762 Sleep related leg cramps: Secondary | ICD-10-CM | POA: Diagnosis not present

## 2023-03-28 LAB — COMPREHENSIVE METABOLIC PANEL
ALT: 24 U/L (ref 0–53)
AST: 24 U/L (ref 0–37)
Albumin: 4.3 g/dL (ref 3.5–5.2)
Alkaline Phosphatase: 39 U/L (ref 39–117)
BUN: 11 mg/dL (ref 6–23)
CO2: 31 mEq/L (ref 19–32)
Calcium: 9.1 mg/dL (ref 8.4–10.5)
Chloride: 99 mEq/L (ref 96–112)
Creatinine, Ser: 0.98 mg/dL (ref 0.40–1.50)
GFR: 86 mL/min (ref >60.00)
Glucose, Bld: 111 mg/dL — ABNORMAL HIGH (ref 70–99)
Potassium: 4.7 mEq/L (ref 3.5–5.1)
Sodium: 137 mEq/L (ref 135–145)
Total Bilirubin: 0.4 mg/dL (ref 0.2–1.2)
Total Protein: 6.7 g/dL (ref 6.0–8.3)

## 2023-03-28 LAB — MAGNESIUM: Magnesium: 2.1 mg/dL (ref 1.5–2.5)

## 2023-03-28 NOTE — Progress Notes (Signed)
Acute Office Visit  Subjective:     Patient ID: Kristopher Yoder, male    DOB: 1966-08-12, 57 y.o.   MRN: [id number]  Chief Complaint  Patient presents with   Leg Pain   Foot Pain     Patient is in today for bilateral leg pain.   Discussed the use of AI scribe software for clinical note transcription with the patient, who gave verbal consent to proceed.  History of Present Illness   The patient, with a history of post-herpetic neuralgia, presents with worsening nocturnal symptoms of leg cramping and paresthesia, described as a sensation of 'walking on pins and needles.' These symptoms have been ongoing for several months and are associated with a sensation of numbness in the feet. The patient also reports intermittent sharp, stabbing sensations in the legs. The symptoms are predominantly nocturnal and do not seem to bother him during the day.  The patient is currently on gabapentin for post-herpetic neuralgia, which provides some relief but does not significantly alleviate the leg symptoms. The patient also reports a difference in sensation when moving his toes during the day.  In addition to these symptoms, the patient experiences muscle cramps in the legs that necessitate getting up and walking around to relieve them. The cramps are accompanied by a tingling sensation in both legs.  The patient also reports that his feet feel cold all the time, necessitating the wearing of two pairs of socks. The feet start to hurt after walking a decent distance, and the patient experiences a return of the painful sensations mainly at night. No symptoms of claudication.   The patient has a history of thrombocytosis and is currently taking a daily baby aspirin as recommended by his hematologist.          ROS All review of systems negative except what is listed in the HPI      Objective:    BP (!) 109/58   Pulse 69   Ht 5\' 9"  (1.753 m)   Wt 174 lb (78.9 kg)   SpO2 99%   BMI 25.70 kg/m     Physical Exam Vitals reviewed.  Constitutional:      Appearance: Normal appearance.  Cardiovascular:     Rate and Rhythm: Normal rate and regular rhythm.  Pulmonary:     Effort: Pulmonary effort is normal.     Breath sounds: Normal breath sounds.  Musculoskeletal:        General: Normal range of motion.     Comments: Mild calf pain with plantar/dorsiflexion bilaterally, no warmth, edema, erythema  Skin:    General: Skin is warm and dry.  Neurological:     General: No focal deficit present.     Mental Status: He is alert and oriented to person, place, and time. Mental status is at baseline.     Sensory: No sensory deficit.     Gait: Gait normal.  Psychiatric:        Mood and Affect: Mood normal.        Behavior: Behavior normal.        Thought Content: Thought content normal.        Judgment: Judgment normal.     No results found for any visits on 03/28/23.      Assessment & Plan:   Problem List Items Addressed This Visit   None Visit Diagnoses     Nocturnal leg cramps    -  Primary   Relevant Orders   Comprehensive metabolic panel  Magnesium   D-Dimer, Quantitative   Other polyneuropathy          Peripheral Neuropathy, leg cramping: Nighttime symptoms of numbness, tingling, and cramping in both feet and legs for several months. Gabapentin provides some relief but symptoms persist. No daytime symptoms. -Check labs to rule out metabolic causes. -Order D-dimer to rule out deep vein thrombosis given calf pain. -Adjust Gabapentin dosing: take last two doses closer together at bedtime. -Could consider referral for nerve conduction studies if symptoms persist or worsen.      No orders of the defined types were placed in this encounter.   Return if symptoms worsen or fail to improve, for (pending results).  Clayborne Dana, NP

## 2023-03-30 ENCOUNTER — Encounter (INDEPENDENT_AMBULATORY_CARE_PROVIDER_SITE_OTHER): Payer: Self-pay

## 2023-04-15 ENCOUNTER — Ambulatory Visit (INDEPENDENT_AMBULATORY_CARE_PROVIDER_SITE_OTHER): Payer: BLUE CROSS/BLUE SHIELD | Admitting: Family Medicine

## 2023-04-15 VITALS — BP 129/73 | HR 75 | Ht 69.0 in | Wt 173.0 lb

## 2023-04-15 DIAGNOSIS — R7989 Other specified abnormal findings of blood chemistry: Secondary | ICD-10-CM

## 2023-04-15 NOTE — Progress Notes (Signed)
   Established Patient Office Visit  Subjective   Patient ID: NARENDER PEDUZZI, male    DOB: 03-Jul-1966  Age: 57 y.o. MRN: [id number]  Chief Complaint  Patient presents with   Medical Management of Chronic Issues    HPI  Patient unsure why this appointment was scheduled. States he thought we needed to follow-up on labs, but our last labs 2 weeks ago were normal. I believe he was looking at a lab results from last year from hematology - he was to follow-up with them for additional pended labs, but never done. States he will just schedule a visit with them to see what he was missing. No new complaints/concerns for today. Let him know I would not charge for this visit.       ROS All review of systems negative except what is listed in the HPI    Objective:     BP 129/73   Pulse 75   Ht 5\' 9"  (1.753 m)   Wt 173 lb (78.5 kg)   SpO2 97%   BMI 25.55 kg/m    Physical Exam declined  No results found for any visits on 04/15/23.    The ASCVD Risk score (Arnett DK, et al., 2019) failed to calculate for the following reasons:   Cannot find a previous HDL lab   Cannot find a previous total cholesterol lab    Assessment & Plan:   Problem List Items Addressed This Visit   None Visit Diagnoses     Abnormal CBC    -  Primary     Declined appointment today. Patient to follow-up with hematology.  No charge.  Return if symptoms worsen or fail to improve.    Clayborne Dana, NP

## 2023-05-14 ENCOUNTER — Other Ambulatory Visit: Payer: Self-pay | Admitting: Family Medicine

## 2023-05-14 DIAGNOSIS — B0229 Other postherpetic nervous system involvement: Secondary | ICD-10-CM

## 2023-05-25 ENCOUNTER — Ambulatory Visit: Payer: BLUE CROSS/BLUE SHIELD | Admitting: Family Medicine

## 2023-06-23 ENCOUNTER — Other Ambulatory Visit: Payer: Self-pay | Admitting: Family Medicine

## 2023-06-23 DIAGNOSIS — B0229 Other postherpetic nervous system involvement: Secondary | ICD-10-CM

## 2023-06-28 ENCOUNTER — Encounter: Payer: Self-pay | Admitting: Family Medicine

## 2023-06-28 ENCOUNTER — Ambulatory Visit (INDEPENDENT_AMBULATORY_CARE_PROVIDER_SITE_OTHER): Payer: BLUE CROSS/BLUE SHIELD | Admitting: Family Medicine

## 2023-06-28 VITALS — BP 98/57 | HR 88 | Ht 69.0 in | Wt 184.0 lb

## 2023-06-28 DIAGNOSIS — G4762 Sleep related leg cramps: Secondary | ICD-10-CM

## 2023-06-28 DIAGNOSIS — Z23 Encounter for immunization: Secondary | ICD-10-CM | POA: Diagnosis not present

## 2023-06-28 LAB — COMPREHENSIVE METABOLIC PANEL
ALT: 31 U/L (ref 0–53)
AST: 30 U/L (ref 0–37)
Albumin: 4.4 g/dL (ref 3.5–5.2)
Alkaline Phosphatase: 36 U/L — ABNORMAL LOW (ref 39–117)
BUN: 11 mg/dL (ref 6–23)
CO2: 29 meq/L (ref 19–32)
Calcium: 9.1 mg/dL (ref 8.4–10.5)
Chloride: 100 meq/L (ref 96–112)
Creatinine, Ser: 0.99 mg/dL (ref 0.40–1.50)
GFR: 84.81 mL/min (ref >60.00)
Glucose, Bld: 171 mg/dL — ABNORMAL HIGH (ref 70–99)
Potassium: 4.7 meq/L (ref 3.5–5.1)
Sodium: 138 meq/L (ref 135–145)
Total Bilirubin: 0.4 mg/dL (ref 0.2–1.2)
Total Protein: 6.8 g/dL (ref 6.0–8.3)

## 2023-06-28 LAB — MAGNESIUM: Magnesium: 1.9 mg/dL (ref 1.5–2.5)

## 2023-06-28 MED ORDER — CYCLOBENZAPRINE HCL 5 MG PO TABS: 5.0000 mg | ORAL_TABLET | Freq: Three times a day (TID) | ORAL | 1 refills | Status: DC | PRN

## 2023-06-28 NOTE — Progress Notes (Signed)
Established Patient Office Visit  Subjective   Patient ID: Kristopher Yoder, male    DOB: [birth year]  Age: 57 y.o. MRN: [id number]  Chief Complaint  Patient presents with   Medical Management of Chronic Issues    HPI   Discussed the use of AI scribe software for clinical note transcription with the patient, who gave verbal consent to proceed.  History of Present Illness   The patient presents with persistent leg cramps at night. They describe the sensation as muscle tensing/cramping, particularly noticeable during sleep. Despite attempts to manage these symptoms with Neurontin, the patient reports no improvement and experiences residual soreness upon waking. The patient has a history of using Soma, a muscle relaxer, which they report was previously beneficial.  The patient has been maintaining hydration and engaging in daily stretching and exercise, but these interventions have not alleviated the symptoms. The patient's electrolytes were last checked in the summer and were within normal limits. They are not currently on any medications known to induce muscle cramps.             ROS All review of systems negative except what is listed in the HPI    Objective:     BP (!) 98/57   Pulse 88   Ht 5\' 9"  (1.753 m)   Wt 184 lb (83.5 kg)   SpO2 98%   BMI 27.17 kg/m    Physical Exam Vitals reviewed.  Constitutional:      Appearance: Normal appearance.  Cardiovascular:     Rate and Rhythm: Normal rate and regular rhythm.     Heart sounds: Normal heart sounds.  Pulmonary:     Effort: Pulmonary effort is normal.     Breath sounds: Normal breath sounds.  Musculoskeletal:        General: No tenderness.     Right lower leg: No edema.     Left lower leg: No edema.  Skin:    General: Skin is warm and dry.     Findings: No bruising, erythema or rash.  Neurological:     Mental Status: He is alert and oriented to person, place, and time.  Psychiatric:        Mood and  Affect: Mood normal.        Behavior: Behavior normal.        Thought Content: Thought content normal.        Judgment: Judgment normal.      No results found for any visits on 06/28/23.    The ASCVD Risk score (Arnett DK, et al., 2019) failed to calculate for the following reasons:   Cannot find a previous HDL lab   Cannot find a previous total cholesterol lab    Assessment & Plan:   Problem List Items Addressed This Visit   None Visit Diagnoses     Flu vaccine need    -  Primary   Relevant Orders   Flu vaccine trivalent PF, 6mos and older(Flulaval,Afluria,Fluarix,Fluzone) (Completed)   Nocturnal leg cramps       Relevant Medications   cyclobenzaprine (FLEXERIL) 5 MG tablet   Other Relevant Orders   Comprehensive metabolic panel   Magnesium         Muscle Cramps Persistent despite hydration, stretching, and Neurontin. No other signs of restless leg syndrome. No other medications that could contribute to muscle cramps. -Discontinue Neurontin as it is not providing relief. -Start Flexeril (Cyclobenzaprine) at bedtime. Cautioned about potential drowsiness and not to drive while on this  medication. -Continue hydration and gentle stretching. -Check electrolytes today to rule out any abnormalities.  Thrombocytosis No recent follow-up with hematology. -Encouraged patient to contact Sarah at hematology via MyChart for follow-up instructions.           Return if symptoms worsen or fail to improve.    Clayborne Dana, NP

## 2023-08-22 ENCOUNTER — Other Ambulatory Visit (HOSPITAL_COMMUNITY)
Admission: RE | Admit: 2023-08-22 | Discharge: 2023-08-22 | Disposition: A | Discharge status: Home / Self Care | Payer: BLUE CROSS/BLUE SHIELD | Source: Ambulatory Visit | Attending: Family Medicine | Admitting: Family Medicine

## 2023-08-22 ENCOUNTER — Ambulatory Visit (INDEPENDENT_AMBULATORY_CARE_PROVIDER_SITE_OTHER): Payer: BLUE CROSS/BLUE SHIELD | Admitting: Family Medicine

## 2023-08-22 VITALS — BP 107/41 | HR 74 | Ht 69.0 in | Wt 186.0 lb

## 2023-08-22 DIAGNOSIS — L293 Anogenital pruritus, unspecified: Secondary | ICD-10-CM | POA: Diagnosis not present

## 2023-08-22 DIAGNOSIS — Z113 Encounter for screening for infections with a predominantly sexual mode of transmission: Secondary | ICD-10-CM

## 2023-08-22 MED ORDER — CLOTRIMAZOLE 1 % EX CREA: 1.0000 | TOPICAL_CREAM | Freq: Two times a day (BID) | CUTANEOUS | 0 refills | Status: AC

## 2023-08-22 NOTE — Progress Notes (Signed)
Acute Office Visit  Subjective:     Patient ID: Kristopher Yoder, male    DOB: 1965-09-18, 57 y.o.   MRN: [id number]  Chief Complaint  Patient presents with   Rash    Rash   Patient is in today for rash/itching to penis.   Discussed the use of AI scribe software for clinical note transcription with the patient, who gave verbal consent to proceed.  History of Present Illness   The patient, with an unspecified medical history, presents with a month-long history of a penile rash. The rash is described as a sore located behind the glans penis, which is painful to clean, particularly with alcohol-based products. The patient reports relief with the application of calmoseptine, but the rash reappears once the product is washed off. The rash is also described as being itchy, with the discomfort intensifying when in contact with clothing. The patient reports a burning sensation when the rash is exposed to hot water. The rash is described as bright red in color, with no flaking or drainage. The patient denies any urinary symptoms and reports no abnormalities with the scrotum or testicles. The patient denies any recent sexual activity, with the last encounter being approximately a year ago. The patient has never experienced a similar issue before. The rash has been gradually worsening over the past month. The patient has not tried any other creams apart from calmoseptine. The patient has no other rashes on his body.              Review of Systems  Skin:  Positive for rash.   All review of systems negative except what is listed in the HPI      Objective:    BP (!) 107/41   Pulse 74   Ht 5\' 9"  (1.753 m)   Wt 186 lb (84.4 kg)   SpO2 97%   BMI 27.47 kg/m    Physical Exam Vitals reviewed. Exam conducted with a chaperone present.  Constitutional:      Appearance: Normal appearance.  Genitourinary:    Penis: Circumcised.     Neurological:     Mental Status: He is alert and  oriented to person, place, and time.  Psychiatric:        Mood and Affect: Mood normal.        Behavior: Behavior normal.        Thought Content: Thought content normal.        Judgment: Judgment normal.           No results found for any visits on 08/22/23.      Assessment & Plan:   Problem List Items Addressed This Visit   None Visit Diagnoses       Screen for STD (sexually transmitted disease)    -  Primary   Relevant Orders   Urine cytology ancillary only   HIV Antibody (routine testing w rflx)   RPR   HSV(herpes simplex vrs) 1+2 ab-IgG     Itching of penis       Relevant Medications   clotrimazole (LOTRIMIN) 1 % cream          Penile Rash Red, itchy rash on the penis present for a month. No discharge, dysuria, or other systemic symptoms. No improvement with calmoseptine. Possible fungal infection. -Start antifungal cream. -Keep area clean and dry. -Return for follow-up if no improvement. -Patient would like routine STD screening today with blood and urine samples -Patient aware of signs and symptoms to monitor  for       Meds ordered this encounter  Medications   clotrimazole (LOTRIMIN) 1 % cream    Sig: Apply 1 Application topically 2 (two) times daily. For 7-14 days.    Dispense:  30 g    Refill:  0    Supervising Provider:   Danise Edge A [[id number]]    Return if symptoms worsen or fail to improve.  Clayborne Dana, NP

## 2023-08-23 LAB — URINE CYTOLOGY ANCILLARY ONLY
Chlamydia: NEGATIVE
Comment: NEGATIVE
Comment: NORMAL
Neisseria Gonorrhea: NEGATIVE

## 2023-08-25 ENCOUNTER — Encounter: Payer: Self-pay | Admitting: Family Medicine

## 2023-08-25 ENCOUNTER — Other Ambulatory Visit: Payer: Self-pay | Admitting: Family Medicine

## 2023-08-25 DIAGNOSIS — A530 Latent syphilis, unspecified as early or late: Secondary | ICD-10-CM

## 2023-08-25 LAB — HSV(HERPES SIMPLEX VRS) I + II AB-IGG
HSV 1 IGG,TYPE SPECIFIC AB: <0.9 {index}
HSV 2 IGG,TYPE SPECIFIC AB: <0.9 {index}

## 2023-08-25 LAB — RPR TITER: RPR Titer: 1:8 {titer} — ABNORMAL HIGH

## 2023-08-25 LAB — HIV ANTIBODY (ROUTINE TESTING W REFLEX): HIV 1&2 Ab, 4th Generation: NONREACTIVE

## 2023-08-25 LAB — RPR: RPR Ser Ql: REACTIVE — AB

## 2023-08-25 LAB — T PALLIDUM AB: T Pallidum Abs: POSITIVE — AB

## 2023-08-30 NOTE — Telephone Encounter (Signed)
Additional lab was also positive. Have him come in for Penicillin G benzathine 2.4 million units IM injection once. He needs to keep infectious disease appointment for next week as scheduled in case they think he needs additional doses and repeat labs.

## 2023-09-02 ENCOUNTER — Ambulatory Visit (INDEPENDENT_AMBULATORY_CARE_PROVIDER_SITE_OTHER): Payer: BLUE CROSS/BLUE SHIELD

## 2023-09-02 DIAGNOSIS — A530 Latent syphilis, unspecified as early or late: Secondary | ICD-10-CM | POA: Diagnosis not present

## 2023-09-02 MED ORDER — PENICILLIN G BENZATHINE & PROC 1200000 UNIT/2ML IM SUSP
2.4000 10*6.[IU] | Freq: Once | INTRAMUSCULAR | Status: AC
  Administered 2023-09-02: 2.4 10*6.[IU] via INTRAMUSCULAR

## 2023-09-02 NOTE — Progress Notes (Signed)
 Pt here for per Kristopher Yoder Have him come in for Penicillin  G benzathine 2.4 million units IM injection once. ROQ and LOQ one in each He needs to keep infectious disease appointment for next week as scheduled in case they think he needs additional doses and repeat labs.

## 2023-09-07 ENCOUNTER — Ambulatory Visit (INDEPENDENT_AMBULATORY_CARE_PROVIDER_SITE_OTHER): Payer: BLUE CROSS/BLUE SHIELD | Admitting: Internal Medicine

## 2023-09-07 ENCOUNTER — Telehealth: Payer: Self-pay

## 2023-09-07 ENCOUNTER — Other Ambulatory Visit: Payer: Self-pay

## 2023-09-07 ENCOUNTER — Encounter: Payer: Self-pay | Admitting: Internal Medicine

## 2023-09-07 VITALS — BP 153/73 | HR 85 | Temp 98.2°F | Ht 69.0 in | Wt 185.0 lb

## 2023-09-07 DIAGNOSIS — R21 Rash and other nonspecific skin eruption: Secondary | ICD-10-CM | POA: Diagnosis not present

## 2023-09-07 DIAGNOSIS — Z8619 Personal history of other infectious and parasitic diseases: Secondary | ICD-10-CM | POA: Diagnosis not present

## 2023-09-07 NOTE — Patient Instructions (Signed)
 Your rash is not syphilis   Since it is recurrent if it occurs again please ask your primary care to refer you to dermatology    Syphilis test (2 forms) 1 will always stay positive 1 will go up with new exposure/infection and then go down (maybe zero or stay low level positive forever).

## 2023-09-07 NOTE — Progress Notes (Signed)
 [facility]  Reason for Consult:primary syphilis Referring Provider: Waddell Mon    Patient Active Problem List   Diagnosis Date Noted   Postherpetic neuralgia 11/04/2021   Drug dependence (HCC) 11/04/2021      HPI: Kristopher Yoder is a 58 y.o. male referred by his pcp here for positive rpr  He [place] his [place] 12/23 [place] for a [place]. Std screen showed rpr titer 1:8 with positive tppa as well. Hiv screen was negative at that time along with urine gc/chlamydia. Patient was referred here for further evaluation  Rash: Worsening for 1 month Red not scaly and no purulence Itchy and painful with the latter more so when he washes with water and using topical alcohol based products. He tried to use calmoseptine ointment but no help.  He reports no sexual exposure for the past year prior to rash onset.  3rd episode Each time it occurs last about 1-3 months. 1 a year ago and incidental syphilis titer 128 at that time. Given 2 weeks doxy. 3-4 months later recurs and resolved on its own after 2 months. The last rash occurs 2-3 months ago and had resolved a couple weeks ago already  He was given a dose bicillin  once with pcp prior to coming here  His wife passed away 1990s He is single Again last sexual exposure 1 year prior to this visit  Chronic back pain  No headache, eye issue, hearing issue, numbness/tingling, other joint pain/back pain, n/v/diarrhea, bruising bleeding, weight loss, decreased appetite, dysuria/urgency, current rash   Review of Systems: ROS All other ros negative      Past Medical History:  Diagnosis Date   Depression    Shingles     Social History   Tobacco Use   Smoking status: Every Day    Current packs/day: 0.00    Types: Cigarettes    Last attempt to quit: 10/30/2019    Years since quitting: 3.8   Smokeless tobacco: Never   Tobacco comments:    0.5 PPD  Vaping Use   Vaping status: Never Used  Substance  Use Topics   Alcohol use: No   Drug use: Yes    Types: Hydrocodone     Comment: last IVDU was early 2024    Family History  Problem Relation Age of Onset   Alcohol abuse Father    Cancer Brother    Heart attack Maternal Grandmother     No Known Allergies  OBJECTIVE: Vitals:   09/07/23 0845  BP: (!) 153/73  Pulse: 85  Temp: 98.2 F (36.8 C)  TempSrc: Temporal  SpO2: 94%  Weight: 185 lb (83.9 kg)  Height: 5' 9 (1.753 m)   Body mass index is 27.32 kg/m.   Physical Exam General/constitutional: no distress, pleasant HEENT: Normocephalic, PER, Conj Clear, EOMI, Oropharynx clear Neck supple CV: rrr no mrg Lungs: clear to auscultation, normal respiratory effort Abd: Soft, Nontender Ext: no edema Skin: No Rash Neuro: nonfocal MSK: no peripheral joint swelling/tenderness/warmth; back spines nontender   Lab: Lab Results  Component Value Date   WBC 9.4 11/26/2021   HGB 12.3 (L) 11/26/2021   HCT 38.3 (L) 11/26/2021   MCV 93.6 11/26/2021   PLT 578 (H) 11/26/2021   Last metabolic panel Lab Results  Component Value Date   GLUCOSE 171 (H) 06/28/2023   NA 138 06/28/2023   K 4.7 06/28/2023   CL 100 06/28/2023   CO2 29 06/28/2023   BUN  11 06/28/2023   CREATININE 0.99 06/28/2023   GFR 84.81 06/28/2023   CALCIUM  9.1 06/28/2023   PROT 6.8 06/28/2023   ALBUMIN 4.4 06/28/2023   BILITOT 0.4 06/28/2023   ALKPHOS 36 (L) 06/28/2023   AST 30 06/28/2023   ALT 31 06/28/2023   ANIONGAP 7 11/26/2021    Microbiology:  Serology:  Imaging:   Assessment/plan: Problem List Items Addressed This Visit   None Visit Diagnoses       Rash    -  Primary     History of syphilis           Rash had resolved 2 weeks prior to visit  Syphillis first onset 1 year ago treated with proper response. No sexual exposure since and rpr titer had come down nicely  The first rash might or might not be related to syphilis. And the recurrent 2 episodes then are not due to  syphilis  No other systemic sx otherwise  I am not sure what rash is. ?erythrashma vs other infectious balanitis vs reactive (less likely as urine gc/chlam negative and he has no other form of sexual practice outside vaginal sex), vs psoriasis  If rash occurs again should go to dermatology  Sent chart to pcp      Follow-up: No follow-ups on file.  Constance ONEIDA Passer, MD [facility] 09/07/2023, 9:03 AM

## 2023-09-07 NOTE — Telephone Encounter (Signed)
 Spoke with Robin at [facility] to obtain RPR history.   07/2022 titer was 1:128  Per Care Everywhere: was treated 08/30/23 with doxycycline 100 mg BID x 14 days  08/22/23 titer is 1:8  Treated with 2.4 MU Bicillin IM on 09/02/23  Sandie Ano, RN

## 2023-10-06 ENCOUNTER — Telehealth: Payer: Self-pay

## 2023-10-06 NOTE — Telephone Encounter (Signed)
 Initial Comment Caller states his fever is 101.5, headache, pain in his right side, nausea. States pain in hin his chest and under armpit. Translation No Nurse Assessment Nurse: Georgeanna, RN, Kacie Date/Time (Eastern Time): 10/06/2023 8:49:20 AM Confirm and document reason for call. If symptomatic, describe symptoms. ---Caller states his fever is 101.5 yesterday, headache, pain in his right side, nausea. Also pain in chin his chest and under armpit. States he started feeling bad yesterday. Does the patient have any new or worsening symptoms? ---Yes Will a triage be completed? ---Yes Related visit to physician within the last 2 weeks? ---No Does the PT have any chronic conditions? (i.e. diabetes, asthma, this includes High risk factors for pregnancy, etc.) ---No Is this a behavioral health or substance abuse call? ---No Guidelines Guideline Title Affirmed Question Affirmed Notes Nurse Date/Time (Eastern Time) Chest Pain History of inherited increased risk of blood clots (e.g., Factor 5 Leiden, Anti-thrombin 3, Protein C or Protein S deficiency, Prothrombin mutation) Hearon, RN, Kacie 10/06/2023 8:51:33 AM PLEASE NOTE: All timestamps contained within this report are represented as Eastern Standard Time. CONFIDENTIALTY NOTICE: This fax transmission is intended only for the addressee. It contains information that is legally privileged, confidential or otherwise protected from use or disclosure. If you are not the intended recipient, you are strictly prohibited from reviewing, disclosing, copying using or disseminating any of this information or taking any action in reliance on or regarding this information. If you have received this fax in error, please notify us  immediately by telephone so that we can arrange for its return to us . Phone: [phone], Toll-Free: [phone], Fax: [phone] Page: 2 of 2 Call Id: [id number] Disp. Time Titus Time) Disposition Final User 10/06/2023  8:47:35 AM Send to Urgent Queue Myer Suan Fellers 10/06/2023 8:59:23 AM Go to ED Now Yes Georgeanna, RN, Kacie Final Disposition 10/06/2023 8:59:23 AM Go to ED Now Yes Georgeanna, RN, Kacie Caller Disagree/Comply Disagree Caller Understands Yes PreDisposition Go to Urgent Care/Walk-In Clinic Care Advice Given Per Guideline CARE ADVICE given per Chest Pain (Adult) guideline. GO TO ED NOW: CALL 911 IF: * Severe difficulty breathing occurs * You become worse Comments User: Kacie, Hearon, RN Date/Time Titus Time): 10/06/2023 9:00:38 AM Caller has appt scheduled for 8am tomorrow morning. Doesn't want to go to ED today. Verified he understood my outcome is go to ED now, suggested that if he was not going to do that then he could go to UC to be seen today. Referrals GO TO FACILITY REFUSED

## 2023-10-06 NOTE — Telephone Encounter (Signed)
 See note

## 2023-10-06 NOTE — Telephone Encounter (Signed)
 Tilman Fonder - appt tomorrow. Refused ED advise.

## 2023-10-06 NOTE — Telephone Encounter (Signed)
 Pt was instructed to go to ED, he refused, states he already has an appt scheduled tomorrow morning.

## 2023-10-07 ENCOUNTER — Encounter: Payer: Self-pay | Admitting: Family Medicine

## 2023-10-07 ENCOUNTER — Ambulatory Visit (INDEPENDENT_AMBULATORY_CARE_PROVIDER_SITE_OTHER): Payer: BLUE CROSS/BLUE SHIELD | Admitting: Family Medicine

## 2023-10-07 VITALS — BP 129/69 | HR 65 | Temp 98.0°F | Ht 69.0 in | Wt 184.0 lb

## 2023-10-07 DIAGNOSIS — J069 Acute upper respiratory infection, unspecified: Secondary | ICD-10-CM | POA: Diagnosis not present

## 2023-10-07 DIAGNOSIS — R509 Fever, unspecified: Secondary | ICD-10-CM | POA: Diagnosis not present

## 2023-10-07 LAB — POCT INFLUENZA A/B
Influenza A, POC: NEGATIVE
Influenza B, POC: NEGATIVE

## 2023-10-07 LAB — POC COVID19 BINAXNOW: SARS Coronavirus 2 Ag: NEGATIVE

## 2023-10-07 MED ORDER — HYDROCOD POLI-CHLORPHE POLI ER 10-8 MG/5ML PO SUER: 5.0000 mL | Freq: Two times a day (BID) | ORAL | 0 refills | Status: AC | PRN

## 2023-10-07 MED ORDER — BENZONATATE 200 MG PO CAPS: 200.0000 mg | ORAL_CAPSULE | Freq: Two times a day (BID) | ORAL | 0 refills | Status: DC | PRN

## 2023-10-07 NOTE — Progress Notes (Signed)
 Acute Office Visit  Subjective:     Patient ID: Kristopher Yoder, male    DOB: 27-Jan-1966, 58 y.o.   MRN: [id number]  Chief Complaint  Patient presents with   Fever     Patient is in today for URI.   Discussed the use of AI scribe software for clinical note transcription with the patient, who gave verbal consent to proceed.  History of Present Illness   Kristopher Yoder is a 58 year old male who presents with headache, cough, sore throat, and fever.  He has been experiencing symptoms for the past two to three days, beginning with a severe headache, cough, sore throat, and high fever. Over-the-counter cold and flu medication provided some relief, but he continued to feel unwell the following morning.  He continues to experience body aches, fatigue, headache, sore throat, and nasal congestion. No vomiting or diarrhea. His cough is productive with thick, clear phlegm. No ear pain. The headache is diffuse, particularly affecting the frontal sinus area.   He has not been in contact with anyone known to be sick. He has received both the COVID-19 and flu vaccinations. Despite the cough, he reports sleeping well.              All review of systems negative except what is listed in the HPI      Objective:    BP 129/69   Pulse 65   Temp 98 F (36.7 C) (Oral)   Ht 5' 9 (1.753 m)   Wt 184 lb (83.5 kg)   SpO2 95%   BMI 27.17 kg/m    Physical Exam Vitals reviewed.  Constitutional:      Appearance: Normal appearance.  HENT:     Head: Normocephalic and atraumatic.     Nose: Congestion and rhinorrhea present.     Mouth/Throat:     Comments: Postnasal drainage Cardiovascular:     Rate and Rhythm: Normal rate and regular rhythm.     Heart sounds: Normal heart sounds.  Pulmonary:     Effort: Pulmonary effort is normal.     Breath sounds: Normal breath sounds.  Lymphadenopathy:     Cervical: No cervical adenopathy.  Skin:    General: Skin is warm and dry.   Neurological:     Mental Status: He is alert and oriented to person, place, and time.  Psychiatric:        Mood and Affect: Mood normal.        Behavior: Behavior normal.        Thought Content: Thought content normal.        Judgment: Judgment normal.     Results for orders placed or performed in visit on 10/07/23  POC COVID-19 BinaxNow  Result Value Ref Range   SARS Coronavirus 2 Ag Negative Negative  POCT Influenza A/B  Result Value Ref Range   Influenza A, POC Negative Negative   Influenza B, POC Negative Negative        Assessment & Plan:   Problem List Items Addressed This Visit   None Visit Diagnoses       Viral URI with cough    -  Primary   Relevant Medications   benzonatate  (TESSALON ) 200 MG capsule   chlorpheniramine-HYDROcodone  (TUSSIONEX) 10-8 MG/5ML     Fever, unspecified fever cause       Relevant Orders   POC COVID-19 BinaxNow (Completed)   POCT Influenza A/B (Completed)     Likely a viral upper respiratory infection  Adding Tessalon  and Tussionex for cough Continue supportive measures including rest, hydration, humidifier use, steam showers, warm compresses to sinuses, warm liquids with lemon and honey, and over-the-counter cough, cold, and analgesics as needed. If symptoms persist 8-10 days, become severe, or return after a few days of feeling better, then please follow-up for repeat evaluation to determine if antibiotics may be necessary.  Meds ordered this encounter  Medications   benzonatate  (TESSALON ) 200 MG capsule    Sig: Take 1 capsule (200 mg total) by mouth 2 (two) times daily as needed for cough.    Dispense:  20 capsule    Refill:  0    Supervising Provider:   DOMENICA BLACKBIRD A [[id number]]   chlorpheniramine-HYDROcodone  (TUSSIONEX) 10-8 MG/5ML    Sig: Take 5 mLs by mouth every 12 (twelve) hours as needed for up to 5 days.    Dispense:  50 mL    Refill:  0    Supervising Provider:   DOMENICA BLACKBIRD A [[id number]]    Return if symptoms worsen or  fail to improve.  Kristopher Yoder Mon, NP

## 2023-10-07 NOTE — Patient Instructions (Signed)
 Likely a viral upper respiratory infection  Adding Tessalon  and Tussionex for cough Continue supportive measures including rest, hydration, humidifier use, steam showers, warm compresses to sinuses, warm liquids with lemon and honey, and over-the-counter cough, cold, and analgesics as needed. If symptoms persist 8-10 days, become severe, or return after a few days of feeling better, then please follow-up for repeat evaluation to determine if antibiotics may be necessary.  Over the counter medications that may be helpful for symptoms:  Guaifenesin 1200 mg extended release tabs twice daily, with plenty of water For cough and congestion Brand name: Mucinex   Pseudoephedrine 30 mg, one or two tabs every 4 to 6 hours For sinus congestion Brand name: Sudafed You must get this from the pharmacy counter.  Oxymetazoline nasal spray each morning, one spray in each nostril, for NO MORE THAN 3 days  For nasal and sinus congestion Brand name: Afrin Saline nasal spray or Saline Nasal Irrigation (Netti Pot, etc) 3-5 times a day For nasal and sinus congestion Brand names: Ocean or AYR Fluticasone nasal spray OR Mometasone nasal spray OR Triamcinolone Acetonide nasal spray - follow directions on the packaging For nasal and sinus congestion Brand name: Flonase, Nasonex, Nasacort Warm salt water gargles  For sore throat Every few hours as needed Alternate ibuprofen  400-600 mg and acetaminophen  1000 mg every 6 hours For fever, body aches, headache Brand names: Motrin  or Advil  and Tylenol  Dextromethorphan 12-hour cough version 30 mg every 12 hours  For cough Brand name: Delsym Stop all other cold medications for now (Nyquil, Dayquil, Tylenol  Cold, Theraflu, etc) and other non-prescription cough/cold preparations. Many of these have the same ingredients listed above and could cause an overdose of medication.   Herbal treatments that have been shown to be helpful in some patients include: Vitamin C 1000  mg per day Zinc 100 mg per day Quercetin 25-500 mg twice a day Melatonin 5-10mg  at bedtime Honey Green Tea  General Instructions Allow your body to rest Drink PLENTY of fluids Typically, we are the most contagious 1-2 days before symptoms start through the first 2-3 days of most severe symptoms. Per CDC guidelines, you can return to school/work when symptoms have started to improve and you have been fever-free for 24 hours. However, recommend you continue extra precautions for the following 5 days (frequent hand hygiene, masking, covering coughs/sneezes, minimize exposure to immunocompromised individuals, etc).  If you develop severe shortness of breath, uncontrolled fevers, coughing up blood, confusion, chest pain, or signs of dehydration (such as significantly decreased urine amounts or dizziness with standing) please go to the nearest ER.

## 2024-02-02 DIAGNOSIS — G8921 Chronic pain due to trauma: Secondary | ICD-10-CM | POA: Insufficient documentation

## 2024-02-27 ENCOUNTER — Ambulatory Visit (INDEPENDENT_AMBULATORY_CARE_PROVIDER_SITE_OTHER): Admitting: Family Medicine

## 2024-02-27 ENCOUNTER — Encounter: Payer: Self-pay | Admitting: Family Medicine

## 2024-02-27 VITALS — BP 117/74 | HR 72 | Ht 69.0 in | Wt 173.0 lb

## 2024-02-27 DIAGNOSIS — M5416 Radiculopathy, lumbar region: Secondary | ICD-10-CM | POA: Diagnosis not present

## 2024-02-27 DIAGNOSIS — S22000A Wedge compression fracture of unspecified thoracic vertebra, initial encounter for closed fracture: Secondary | ICD-10-CM

## 2024-02-27 DIAGNOSIS — G629 Polyneuropathy, unspecified: Secondary | ICD-10-CM
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
## 2024-03-23 ENCOUNTER — Encounter: Payer: Self-pay | Admitting: Family Medicine

## 2024-03-23 DIAGNOSIS — S22000A Wedge compression fracture of unspecified thoracic vertebra, initial encounter for closed fracture: Secondary | ICD-10-CM

## 2024-03-23 MED ORDER — TIZANIDINE HCL 4 MG PO TABS: 4.0000 mg | ORAL_TABLET | Freq: Two times a day (BID) | ORAL | 0 refills | Status: DC | PRN

## 2024-03-26 ENCOUNTER — Other Ambulatory Visit: Payer: Self-pay | Admitting: Family Medicine

## 2024-03-26 DIAGNOSIS — S22000A Wedge compression fracture of unspecified thoracic vertebra, initial encounter for closed fracture: Secondary | ICD-10-CM

## 2024-03-26 MED ORDER — TIZANIDINE HCL 4 MG PO TABS: 4.0000 mg | ORAL_TABLET | Freq: Three times a day (TID) | ORAL | 0 refills | Status: DC | PRN

## 2024-03-28 ENCOUNTER — Encounter: Payer: Self-pay | Admitting: Physical Medicine and Rehabilitation

## 2024-03-28 ENCOUNTER — Ambulatory Visit: Payer: MEDICAID | Admitting: Physical Medicine and Rehabilitation

## 2024-03-28 DIAGNOSIS — S22070A Wedge compression fracture of T9-T10 vertebra, initial encounter for closed fracture: Secondary | ICD-10-CM

## 2024-03-28 DIAGNOSIS — M546 Pain in thoracic spine: Secondary | ICD-10-CM

## 2024-03-28 DIAGNOSIS — M7918 Myalgia, other site: Secondary | ICD-10-CM

## 2024-03-28 DIAGNOSIS — G8929 Other chronic pain: Secondary | ICD-10-CM

## 2024-03-28 DIAGNOSIS — S22060A Wedge compression fracture of T7-T8 vertebra, initial encounter for closed fracture: Secondary | ICD-10-CM | POA: Diagnosis not present

## 2024-03-28 DIAGNOSIS — S22050A Wedge compression fracture of T5-T6 vertebra, initial encounter for closed fracture: Secondary | ICD-10-CM

## 2024-03-28 DIAGNOSIS — M542 Cervicalgia: Secondary | ICD-10-CM
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
## 2024-03-28 NOTE — Progress Notes (Unsigned)
 Core Outcome Measures Index (COMI) Back Score  Average Pain 8  COMI Score 80%
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
## 2024-03-29 ENCOUNTER — Other Ambulatory Visit: Payer: Self-pay | Admitting: Physical Medicine and Rehabilitation

## 2024-03-29 MED ORDER — HYDROCODONE-ACETAMINOPHEN 5-325 MG PO TABS: 1.0000 | ORAL_TABLET | Freq: Three times a day (TID) | ORAL | 0 refills | Status: AC | PRN

## 2024-03-29 NOTE — Telephone Encounter (Signed)
 Please advise if okay to send pain management referral

## 2024-03-29 NOTE — Addendum Note (Signed)
 Addended by: October Peery L on: 03/29/2024 02:25 PM   Modules accepted: Orders

## 2024-04-04 ENCOUNTER — Encounter: Payer: Self-pay | Admitting: Physical Medicine and Rehabilitation

## 2024-04-05 ENCOUNTER — Encounter: Payer: Self-pay | Admitting: Physical Medicine and Rehabilitation

## 2024-04-08 ENCOUNTER — Other Ambulatory Visit

## 2024-04-09 DIAGNOSIS — D696 Thrombocytopenia, unspecified: Secondary | ICD-10-CM | POA: Insufficient documentation

## 2024-04-09 DIAGNOSIS — G40909 Epilepsy, unspecified, not intractable, without status epilepticus: Secondary | ICD-10-CM | POA: Insufficient documentation

## 2024-04-18 ENCOUNTER — Telehealth: Payer: Self-pay | Admitting: Family Medicine

## 2024-04-18 ENCOUNTER — Other Ambulatory Visit: Payer: Self-pay | Admitting: Family Medicine

## 2024-04-18 DIAGNOSIS — Z87891 Personal history of nicotine dependence: Secondary | ICD-10-CM

## 2024-04-18 DIAGNOSIS — R918 Other nonspecific abnormal finding of lung field: Secondary | ICD-10-CM

## 2024-04-18 DIAGNOSIS — R937 Abnormal findings on diagnostic imaging of other parts of musculoskeletal system: Secondary | ICD-10-CM

## 2024-04-18 DIAGNOSIS — S22000A Wedge compression fracture of unspecified thoracic vertebra, initial encounter for closed fracture: Secondary | ICD-10-CM

## 2024-04-18 NOTE — Telephone Encounter (Signed)
 Received fax from [facility] requesting we investigate incidental findings from his recent MRI Thoracic Spine  Indeterminate pulmonary opacities within the right upper lobe. Further evaluation is suggested with chest CT.  I called the patient. He reports no known history of this. He is a former smoker; quit about 9 months ago. He is agreeable to CT scan to further investigate. I will place order.

## 2024-04-25 ENCOUNTER — Encounter: Payer: Self-pay | Admitting: Family Medicine

## 2024-04-26 NOTE — Telephone Encounter (Signed)
 Can we see if this is waiting on authorization?

## 2024-05-03 ENCOUNTER — Ambulatory Visit (INDEPENDENT_AMBULATORY_CARE_PROVIDER_SITE_OTHER): Payer: MEDICAID | Admitting: Family Medicine

## 2024-05-03 VITALS — BP 130/68 | HR 65 | Ht 69.0 in | Wt 175.0 lb

## 2024-05-03 DIAGNOSIS — R0781 Pleurodynia: Secondary | ICD-10-CM | POA: Diagnosis not present

## 2024-05-03 DIAGNOSIS — K122 Cellulitis and abscess of mouth: Secondary | ICD-10-CM | POA: Diagnosis not present

## 2024-05-03 DIAGNOSIS — Z23 Encounter for immunization: Secondary | ICD-10-CM | POA: Diagnosis not present

## 2024-05-03 DIAGNOSIS — Z1331 Encounter for screening for depression: Secondary | ICD-10-CM

## 2024-05-03 MED ORDER — AMOXICILLIN-POT CLAVULANATE 875-125 MG PO TABS: 1.0000 | ORAL_TABLET | Freq: Two times a day (BID) | ORAL | 0 refills | Status: AC
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
## 2024-05-10 ENCOUNTER — Encounter: Payer: Self-pay | Admitting: Family Medicine

## 2024-05-29 NOTE — Telephone Encounter (Signed)
 I attempted to call his insurance appeals department to try to get the CT scan approved, but had to leave a message. Let me know if they call back. They said we were outside of peer-to-peer window and would have to do an appeal. MRI Thoracic Spine from [facility]) is where the CT scan was recommended, but I don't believe this made it into the first request so we need to get this to them somehow to see if they will get it approved for him.

## 2024-05-31 ENCOUNTER — Other Ambulatory Visit: Payer: Self-pay | Admitting: Family Medicine

## 2024-05-31 DIAGNOSIS — B0229 Other postherpetic nervous system involvement: Secondary | ICD-10-CM

## 2024-06-01 ENCOUNTER — Other Ambulatory Visit

## 2024-06-01 NOTE — Telephone Encounter (Signed)
 Looks like [facility] ordered this MRI. Routing to this provider.   RICK [place].

## 2024-06-05 ENCOUNTER — Telehealth: Payer: Self-pay | Admitting: Family Medicine

## 2024-06-05 DIAGNOSIS — Z87891 Personal history of nicotine dependence: Secondary | ICD-10-CM

## 2024-06-05 DIAGNOSIS — R937 Abnormal findings on diagnostic imaging of other parts of musculoskeletal system: Secondary | ICD-10-CM

## 2024-06-05 DIAGNOSIS — R918 Other nonspecific abnormal finding of lung field: Secondary | ICD-10-CM

## 2024-06-05 NOTE — Telephone Encounter (Signed)
 I never heard back from appeals department, but given that patient is a former smoker, we can probably get the CT scan to look at his lungs with that history. Also mentioned on order that there were opacities noted in the MRI Thoracic Spine from [facility]. We will see if this gets approved and adjust plan as needed.

## 2024-06-06 ENCOUNTER — Other Ambulatory Visit: Payer: Self-pay | Admitting: *Deleted

## 2024-06-06 ENCOUNTER — Other Ambulatory Visit: Payer: Self-pay | Admitting: Physical Medicine and Rehabilitation

## 2024-06-06 DIAGNOSIS — M542 Cervicalgia: Secondary | ICD-10-CM

## 2024-06-06 DIAGNOSIS — M5412 Radiculopathy, cervical region: Secondary | ICD-10-CM

## 2024-06-11 ENCOUNTER — Ambulatory Visit (HOSPITAL_COMMUNITY)
Admission: RE | Admit: 2024-06-11 | Discharge: 2024-06-11 | Disposition: A | Discharge status: Home / Self Care | Payer: MEDICAID | Source: Ambulatory Visit | Attending: Physical Medicine and Rehabilitation | Admitting: Physical Medicine and Rehabilitation

## 2024-06-11 ENCOUNTER — Ambulatory Visit (HOSPITAL_COMMUNITY): Payer: MEDICAID

## 2024-06-11 DIAGNOSIS — M542 Cervicalgia: Secondary | ICD-10-CM | POA: Diagnosis present

## 2024-06-11 DIAGNOSIS — M5412 Radiculopathy, cervical region: Secondary | ICD-10-CM | POA: Insufficient documentation

## 2024-06-15 ENCOUNTER — Ambulatory Visit (HOSPITAL_BASED_OUTPATIENT_CLINIC_OR_DEPARTMENT_OTHER)
Admission: RE | Admit: 2024-06-15 | Discharge: 2024-06-15 | Disposition: A | Payer: MEDICAID | Source: Ambulatory Visit | Attending: Family Medicine

## 2024-06-15 DIAGNOSIS — R918 Other nonspecific abnormal finding of lung field: Secondary | ICD-10-CM | POA: Diagnosis present

## 2024-06-15 DIAGNOSIS — Z87891 Personal history of nicotine dependence: Secondary | ICD-10-CM | POA: Insufficient documentation

## 2024-06-15 DIAGNOSIS — R937 Abnormal findings on diagnostic imaging of other parts of musculoskeletal system: Secondary | ICD-10-CM | POA: Diagnosis present

## 2024-06-19 ENCOUNTER — Telehealth: Payer: Self-pay | Admitting: Neurology

## 2024-06-19 ENCOUNTER — Ambulatory Visit: Payer: Self-pay | Admitting: Family Medicine

## 2024-06-19 DIAGNOSIS — J439 Emphysema, unspecified: Secondary | ICD-10-CM

## 2024-06-19 DIAGNOSIS — I2510 Atherosclerotic heart disease of native coronary artery without angina pectoris: Secondary | ICD-10-CM

## 2024-06-19 DIAGNOSIS — J181 Lobar pneumonia, unspecified organism: Secondary | ICD-10-CM

## 2024-06-19 MED ORDER — AMOXICILLIN-POT CLAVULANATE 875-125 MG PO TABS: 1.0000 | ORAL_TABLET | Freq: Two times a day (BID) | ORAL | 0 refills | Status: AC

## 2024-06-19 MED ORDER — DOXYCYCLINE HYCLATE 100 MG PO TABS: 100.0000 mg | ORAL_TABLET | Freq: Two times a day (BID) | ORAL | 0 refills | Status: AC

## 2024-06-19 NOTE — Telephone Encounter (Signed)
 Result note added. I called patient to discuss.

## 2024-06-19 NOTE — Telephone Encounter (Signed)
 Kristopher Yoder with [facility] called with radiology results of CT done today. Results in EPIC. Please review.

## 2024-06-21 ENCOUNTER — Ambulatory Visit (HOSPITAL_BASED_OUTPATIENT_CLINIC_OR_DEPARTMENT_OTHER)
Admission: RE | Admit: 2024-06-21 | Discharge: 2024-06-21 | Disposition: A | Discharge status: Home / Self Care | Payer: Self-pay | Source: Ambulatory Visit | Attending: Family Medicine | Admitting: Family Medicine

## 2024-06-21 DIAGNOSIS — I2510 Atherosclerotic heart disease of native coronary artery without angina pectoris: Secondary | ICD-10-CM | POA: Insufficient documentation

## 2024-06-22 MED ORDER — ROSUVASTATIN CALCIUM 5 MG PO TABS: 5.0000 mg | ORAL_TABLET | Freq: Every day | ORAL | 1 refills | Status: AC

## 2024-06-29 ENCOUNTER — Encounter: Payer: Self-pay | Admitting: Family Medicine

## 2024-07-02 ENCOUNTER — Encounter: Payer: Self-pay | Admitting: Radiology

## 2024-07-10 ENCOUNTER — Encounter: Payer: Self-pay | Admitting: Pulmonary Disease

## 2024-07-10 ENCOUNTER — Ambulatory Visit: Payer: MEDICAID | Admitting: Pulmonary Disease

## 2024-07-10 VITALS — BP 120/84 | HR 67 | Temp 98.1°F | Ht 69.0 in | Wt 176.0 lb

## 2024-07-10 DIAGNOSIS — J439 Emphysema, unspecified: Secondary | ICD-10-CM

## 2024-07-10 DIAGNOSIS — R918 Other nonspecific abnormal finding of lung field: Secondary | ICD-10-CM

## 2024-07-10 DIAGNOSIS — J181 Lobar pneumonia, unspecified organism: Secondary | ICD-10-CM

## 2024-07-10 DIAGNOSIS — J432 Centrilobular emphysema: Secondary | ICD-10-CM

## 2024-07-10 DIAGNOSIS — Z87891 Personal history of nicotine dependence: Secondary | ICD-10-CM

## 2024-07-10 NOTE — Patient Instructions (Signed)
 Will order a CT chest for evaluation in 3 months from the last CT scan Will also order pulm function test to evaluate COPD Return to clinic in end of February, early January

## 2024-07-10 NOTE — Progress Notes (Signed)
 Kristopher Yoder    [id number]    August 22, 1966  Primary Care Physician:Beck, Waddell NOVAK, NP  Referring Physician: Almarie Waddell NOVAK, NP [address] [place],  [place] [address]  Chief complaint: Consult for emphysema, abnormal CT  HPI: 58 y.o. who  has a past medical history of Depression and Shingles.  Discussed the use of AI scribe software for clinical note transcription with the patient, who gave verbal consent to proceed.  Discussed the use of AI scribe software for clinical note transcription with the patient, who gave verbal consent to proceed.  58 year old with smoking history presenting with dyspnea, wheezing slowly progressing over several years.  Not on any inhalers Denies any chest congestion, fevers or chills.  Recently had a cardiac CT and a low-dose screening CT which showed emphysema and mild tree-in-bud nodularity, atelectasis and has been referred here for further evaluation  Relevant Pulmonary history: Pets: Dog Occupation: Works as a water engineer Exposures: No mold, hot tub, Jacuzzi.  No feather pillow comforter No h/o chemo/XRT/amiodarone/macrodantin/MTX  No exposure to asbestos, silica or other organic allergens  Smoking history: 40-pack-year smoker.  Quit in 2024 Travel history: No significant travel history Family history: Father had COPD from smoking   Outpatient Encounter Medications as of 07/10/2024  Medication Sig   clotrimazole  (LOTRIMIN ) 1 % cream Apply 1 Application topically 2 (two) times daily. For 7-14 days.   gabapentin  (NEURONTIN ) 300 MG capsule Take 300 mg by mouth 4 (four) times daily.   HYDROcodone -acetaminophen  (NORCO) 10-325 MG tablet Take 1 tablet by mouth every 6 (six) hours as needed.   meloxicam (MOBIC) 15 MG tablet Take 15 mg by mouth daily.   METHADONE HCL PO Take 130 mg by mouth daily.   rosuvastatin (CRESTOR) 5 MG tablet Take 1 tablet (5 mg total) by mouth daily.   tiZANidine  (ZANAFLEX ) 4 MG tablet TAKE  1 TABLET (4 MG TOTAL) BY MOUTH EVERY 8 (EIGHT) HOURS AS NEEDED FOR MUSCLE SPASMS   No facility-administered encounter medications on file as of 07/10/2024.     Physical Exam:  Today's Vitals   07/10/24 0824  BP: 120/84  Pulse: 67  Temp: 98.1 F (36.7 C)  TempSrc: Oral  SpO2: 99%  Weight: 176 lb (79.8 kg)  Height: 5' 9 (1.753 m)   Body mass index is 25.99 kg/m.  Gen:      No acute distress HEENT:  EOMI, sclera anicteric Neck:     No masses; no thyromegaly Lungs:    Clear to auscultation bilaterally; normal respiratory effort CV:         Regular rate and rhythm; no murmurs Abd:      + bowel sounds; soft, non-tender; no palpable masses, no distension Ext:    No edema; adequate peripheral perfusion Neuro: alert and oriented x 3 Psych: normal mood and affect   Data Reviewed: Imaging: Lung cancer screening CT 06/15/2024- Emphysema with bronchial wall thickening.  Scattered consolidation, atelectasis in bilaterally with mild tree-in-bud nodularity.  Cardiac CT 06/21/2024-visualized lungs show chronic sequelae of inflammation or infection unchanged compared to prior scan. I reviewed the images personally.  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  Will order PFTs for evaluation.  May need inhalers but patient would like to hold off for now  Abnormal CT scan Has mild chronic changes of emphysematous changes, scattered postinflammatory scarring and atelectasis. Order follow-up CT in 3 months to make  sure there is no underlying malignancy.   Recommendations: Follow-up CT, PFT  I personally spent a total of 45 minutes in the care of the patient today including preparing to see the patient, getting/reviewing separately obtained history, counseling and educating, placing orders, documenting clinical information in the EHR, independently interpreting results, communicating results, and coordinating care.    [name] MD [facility] 07/10/2024, 8:33 AM  CC: Almarie Waddell NOVAK, NP

## 2024-08-18 ENCOUNTER — Other Ambulatory Visit: Payer: Self-pay | Admitting: Family Medicine

## 2024-08-18 DIAGNOSIS — S22000A Wedge compression fracture of unspecified thoracic vertebra, initial encounter for closed fracture: Secondary | ICD-10-CM

## 2024-09-02 ENCOUNTER — Encounter: Payer: Self-pay | Admitting: Pulmonary Disease

## 2024-09-03 ENCOUNTER — Other Ambulatory Visit: Payer: Self-pay | Admitting: Family Medicine

## 2024-09-03 DIAGNOSIS — J069 Acute upper respiratory infection, unspecified: Secondary | ICD-10-CM

## 2024-09-03 DIAGNOSIS — J181 Lobar pneumonia, unspecified organism: Secondary | ICD-10-CM

## 2024-09-03 NOTE — Telephone Encounter (Signed)
 Please advise

## 2024-09-05 MED ORDER — PROMETHAZINE-DM 6.25-15 MG/5ML PO SYRP: 5.0000 mL | ORAL_SOLUTION | Freq: Four times a day (QID) | ORAL | 1 refills | Status: AC | PRN

## 2024-09-20 ENCOUNTER — Encounter: Payer: Self-pay | Admitting: Pulmonary Disease

## 2024-09-24 ENCOUNTER — Ambulatory Visit (HOSPITAL_BASED_OUTPATIENT_CLINIC_OR_DEPARTMENT_OTHER): Payer: MEDICAID

## 2024-10-03 ENCOUNTER — Ambulatory Visit (HOSPITAL_BASED_OUTPATIENT_CLINIC_OR_DEPARTMENT_OTHER)
Admission: RE | Admit: 2024-10-03 | Discharge: 2024-10-03 | Disposition: A | Payer: MEDICAID | Source: Ambulatory Visit | Attending: Pulmonary Disease

## 2024-10-03 DIAGNOSIS — J432 Centrilobular emphysema: Secondary | ICD-10-CM

## 2024-10-03 DIAGNOSIS — J181 Lobar pneumonia, unspecified organism: Secondary | ICD-10-CM

## 2024-10-16 ENCOUNTER — Ambulatory Visit: Payer: MEDICAID | Admitting: Pulmonary Disease
